# Patient Record
Sex: Female | Born: 1940 | Race: White | Hispanic: No | Marital: Married | Smoking: Never smoker
Health system: Southern US, Community
[De-identification: ages and names within clinical notes are randomized; demographics above are authoritative.]

## PROBLEM LIST (undated history)

## (undated) DIAGNOSIS — Z972 Presence of dental prosthetic device (complete) (partial): Secondary | ICD-10-CM

## (undated) DIAGNOSIS — Z87898 Personal history of other specified conditions: Secondary | ICD-10-CM

## (undated) DIAGNOSIS — M1990 Unspecified osteoarthritis, unspecified site: Secondary | ICD-10-CM

## (undated) DIAGNOSIS — Z9889 Other specified postprocedural states: Secondary | ICD-10-CM

## (undated) DIAGNOSIS — I10 Essential (primary) hypertension: Secondary | ICD-10-CM

## (undated) DIAGNOSIS — R112 Nausea with vomiting, unspecified: Secondary | ICD-10-CM

## (undated) DIAGNOSIS — Z86718 Personal history of other venous thrombosis and embolism: Secondary | ICD-10-CM

## (undated) DIAGNOSIS — C50412 Malignant neoplasm of upper-outer quadrant of left female breast: Principal | ICD-10-CM

## (undated) DIAGNOSIS — Z973 Presence of spectacles and contact lenses: Secondary | ICD-10-CM

## (undated) DIAGNOSIS — E785 Hyperlipidemia, unspecified: Secondary | ICD-10-CM

## (undated) DIAGNOSIS — K219 Gastro-esophageal reflux disease without esophagitis: Secondary | ICD-10-CM

## (undated) HISTORY — DX: Hyperlipidemia, unspecified: E78.5

## (undated) HISTORY — DX: Unspecified osteoarthritis, unspecified site: M19.90

## (undated) HISTORY — DX: Essential (primary) hypertension: I10

## (undated) HISTORY — PX: DILATION AND CURETTAGE OF UTERUS: SHX78

## (undated) HISTORY — PX: TONSILLECTOMY: SUR1361

## (undated) HISTORY — DX: Malignant neoplasm of upper-outer quadrant of left female breast: C50.412

---

## 1970-02-28 DIAGNOSIS — Z86718 Personal history of other venous thrombosis and embolism: Secondary | ICD-10-CM

## 1970-02-28 HISTORY — PX: PULMONARY EMBOLISM SURGERY: SHX752

## 1970-02-28 HISTORY — DX: Personal history of other venous thrombosis and embolism: Z86.718

## 1988-10-29 HISTORY — PX: ANTERIOR FUSION CERVICAL SPINE: SUR626

## 1999-05-03 ENCOUNTER — Encounter: Payer: Self-pay | Admitting: Neurosurgery

## 1999-05-06 ENCOUNTER — Inpatient Hospital Stay (HOSPITAL_COMMUNITY): Admission: RE | Admit: 1999-05-06 | Discharge: 1999-05-07 | Payer: Self-pay | Admitting: Neurosurgery

## 1999-05-06 ENCOUNTER — Encounter: Payer: Self-pay | Admitting: Neurosurgery

## 1999-06-23 ENCOUNTER — Encounter: Payer: Self-pay | Admitting: Neurosurgery

## 1999-06-23 ENCOUNTER — Encounter: Admission: RE | Admit: 1999-06-23 | Discharge: 1999-06-23 | Payer: Self-pay | Admitting: Neurosurgery

## 2002-02-28 ENCOUNTER — Encounter (INDEPENDENT_AMBULATORY_CARE_PROVIDER_SITE_OTHER): Payer: Self-pay | Admitting: Internal Medicine

## 2002-02-28 HISTORY — PX: REPLACEMENT TOTAL KNEE: SUR1224

## 2002-02-28 LAB — CONVERTED CEMR LAB: Pap Smear: NORMAL

## 2002-09-23 ENCOUNTER — Encounter: Admission: RE | Admit: 2002-09-23 | Discharge: 2002-09-23 | Payer: Self-pay | Admitting: Occupational Medicine

## 2002-09-23 ENCOUNTER — Encounter: Payer: Self-pay | Admitting: Occupational Medicine

## 2003-01-31 ENCOUNTER — Inpatient Hospital Stay (HOSPITAL_COMMUNITY): Admission: RE | Admit: 2003-01-31 | Discharge: 2003-02-04 | Payer: Self-pay | Admitting: Specialist

## 2004-02-05 ENCOUNTER — Ambulatory Visit: Payer: Self-pay | Admitting: Internal Medicine

## 2004-03-09 ENCOUNTER — Ambulatory Visit: Payer: Self-pay | Admitting: Internal Medicine

## 2004-03-16 ENCOUNTER — Ambulatory Visit: Payer: Self-pay | Admitting: Internal Medicine

## 2004-03-19 ENCOUNTER — Ambulatory Visit: Payer: Self-pay | Admitting: Internal Medicine

## 2004-06-08 ENCOUNTER — Ambulatory Visit: Payer: Self-pay | Admitting: Internal Medicine

## 2005-05-02 ENCOUNTER — Ambulatory Visit: Payer: Self-pay | Admitting: Internal Medicine

## 2005-05-02 LAB — CONVERTED CEMR LAB
AST: 22 units/L
Alkaline Phosphatase: 62 units/L
BUN: 11 mg/dL
Basophils Relative: 0.4 %
CO2: 29 meq/L
Chloride: 106 meq/L
Creatinine, Ser: 0.7 mg/dL
Eosinophils Absolute: 0.1 10*3/uL
Eosinophils Relative: 2.7 %
HCT: 40.1 %
HDL: 34.8 mg/dL
Hemoglobin: 13.9 g/dL
MCHC: 34.6 g/dL
MCV: 89.5 fL
Monocytes Absolute: 0.5 10*3/uL
Monocytes Relative: 10.2 %
Neutro Abs: 1.9 10*3/uL
Potassium: 4.1 meq/L
RBC: 4.48 M/uL
RDW: 11.9 %
Sodium: 142 meq/L
TSH: 1.33 microintl units/mL
Total Bilirubin: 0.9 mg/dL
Total CHOL/HDL Ratio: 5.9

## 2005-05-05 ENCOUNTER — Ambulatory Visit: Payer: Self-pay | Admitting: Internal Medicine

## 2005-05-31 ENCOUNTER — Ambulatory Visit: Payer: Self-pay | Admitting: Gastroenterology

## 2005-06-07 ENCOUNTER — Ambulatory Visit: Payer: Self-pay | Admitting: Gastroenterology

## 2005-07-21 ENCOUNTER — Emergency Department (HOSPITAL_COMMUNITY): Admission: EM | Admit: 2005-07-21 | Discharge: 2005-07-21 | Payer: Self-pay | Admitting: Emergency Medicine

## 2006-01-23 ENCOUNTER — Ambulatory Visit (HOSPITAL_BASED_OUTPATIENT_CLINIC_OR_DEPARTMENT_OTHER): Admission: RE | Admit: 2006-01-23 | Discharge: 2006-01-23 | Payer: Self-pay | Admitting: Specialist

## 2006-02-03 ENCOUNTER — Ambulatory Visit: Payer: Self-pay | Admitting: Internal Medicine

## 2006-02-06 ENCOUNTER — Encounter (INDEPENDENT_AMBULATORY_CARE_PROVIDER_SITE_OTHER): Payer: Self-pay | Admitting: Internal Medicine

## 2006-02-06 DIAGNOSIS — J309 Allergic rhinitis, unspecified: Secondary | ICD-10-CM | POA: Insufficient documentation

## 2006-02-06 DIAGNOSIS — M1990 Unspecified osteoarthritis, unspecified site: Secondary | ICD-10-CM | POA: Insufficient documentation

## 2006-02-06 DIAGNOSIS — Z86718 Personal history of other venous thrombosis and embolism: Secondary | ICD-10-CM

## 2006-02-06 DIAGNOSIS — F329 Major depressive disorder, single episode, unspecified: Secondary | ICD-10-CM | POA: Insufficient documentation

## 2006-02-06 DIAGNOSIS — K219 Gastro-esophageal reflux disease without esophagitis: Secondary | ICD-10-CM | POA: Insufficient documentation

## 2006-02-06 DIAGNOSIS — E785 Hyperlipidemia, unspecified: Secondary | ICD-10-CM

## 2006-02-14 ENCOUNTER — Ambulatory Visit (HOSPITAL_COMMUNITY): Admission: RE | Admit: 2006-02-14 | Discharge: 2006-02-14 | Payer: Self-pay | Admitting: Internal Medicine

## 2006-02-15 ENCOUNTER — Encounter (INDEPENDENT_AMBULATORY_CARE_PROVIDER_SITE_OTHER): Payer: Self-pay | Admitting: Internal Medicine

## 2006-02-28 HISTORY — PX: COLONOSCOPY: SHX174

## 2006-06-22 ENCOUNTER — Ambulatory Visit: Payer: Self-pay | Admitting: Internal Medicine

## 2006-06-22 DIAGNOSIS — M949 Disorder of cartilage, unspecified: Secondary | ICD-10-CM

## 2006-06-22 DIAGNOSIS — M899 Disorder of bone, unspecified: Secondary | ICD-10-CM | POA: Insufficient documentation

## 2006-06-22 DIAGNOSIS — I4949 Other premature depolarization: Secondary | ICD-10-CM

## 2006-06-23 LAB — CONVERTED CEMR LAB
ALT: 18 units/L (ref 0–35)
Basophils Absolute: 0.1 10*3/uL (ref 0.0–0.1)
CO2: 22 meq/L (ref 19–32)
Calcium: 9.4 mg/dL (ref 8.4–10.5)
Chloride: 105 meq/L (ref 96–112)
Cholesterol: 228 mg/dL — ABNORMAL HIGH (ref 0–200)
Hemoglobin: 14.4 g/dL (ref 12.0–15.0)
Lymphocytes Relative: 42 % (ref 12–46)
Lymphs Abs: 2 10*3/uL (ref 0.7–3.3)
Monocytes Absolute: 0.4 10*3/uL (ref 0.2–0.7)
Neutro Abs: 2.2 10*3/uL (ref 1.7–7.7)
Platelets: 176 10*3/uL (ref 150–400)
RDW: 13 % (ref 11.5–14.0)
Sodium: 140 meq/L (ref 135–145)
Total Protein: 7.1 g/dL (ref 6.0–8.3)
WBC: 4.8 10*3/uL (ref 4.0–10.5)

## 2006-11-10 ENCOUNTER — Ambulatory Visit: Payer: Self-pay | Admitting: Internal Medicine

## 2006-11-10 DIAGNOSIS — J209 Acute bronchitis, unspecified: Secondary | ICD-10-CM

## 2006-11-24 ENCOUNTER — Ambulatory Visit: Payer: Self-pay | Admitting: Internal Medicine

## 2007-01-16 ENCOUNTER — Telehealth (INDEPENDENT_AMBULATORY_CARE_PROVIDER_SITE_OTHER): Payer: Self-pay | Admitting: Internal Medicine

## 2007-01-16 ENCOUNTER — Ambulatory Visit: Payer: Self-pay | Admitting: Internal Medicine

## 2007-02-27 ENCOUNTER — Telehealth (INDEPENDENT_AMBULATORY_CARE_PROVIDER_SITE_OTHER): Payer: Self-pay | Admitting: *Deleted

## 2007-02-27 ENCOUNTER — Ambulatory Visit: Payer: Self-pay | Admitting: Internal Medicine

## 2007-02-27 DIAGNOSIS — M545 Low back pain: Secondary | ICD-10-CM | POA: Insufficient documentation

## 2008-02-08 ENCOUNTER — Ambulatory Visit: Payer: Self-pay | Admitting: Internal Medicine

## 2008-02-08 DIAGNOSIS — R209 Unspecified disturbances of skin sensation: Secondary | ICD-10-CM

## 2008-02-13 ENCOUNTER — Ambulatory Visit (HOSPITAL_COMMUNITY): Admission: RE | Admit: 2008-02-13 | Discharge: 2008-02-13 | Payer: Self-pay | Admitting: Internal Medicine

## 2008-02-18 ENCOUNTER — Telehealth (INDEPENDENT_AMBULATORY_CARE_PROVIDER_SITE_OTHER): Payer: Self-pay | Admitting: *Deleted

## 2008-02-20 ENCOUNTER — Encounter (INDEPENDENT_AMBULATORY_CARE_PROVIDER_SITE_OTHER): Payer: Self-pay | Admitting: Internal Medicine

## 2008-07-25 ENCOUNTER — Ambulatory Visit: Payer: Self-pay | Admitting: Internal Medicine

## 2008-07-25 DIAGNOSIS — I10 Essential (primary) hypertension: Secondary | ICD-10-CM

## 2008-07-25 DIAGNOSIS — H8110 Benign paroxysmal vertigo, unspecified ear: Secondary | ICD-10-CM | POA: Insufficient documentation

## 2008-07-30 LAB — CONVERTED CEMR LAB
Albumin: 4 g/dL (ref 3.5–5.2)
BUN: 13 mg/dL (ref 6–23)
CO2: 25 meq/L (ref 19–32)
Calcium: 8.6 mg/dL (ref 8.4–10.5)
Chloride: 105 meq/L (ref 96–112)
Glucose, Bld: 95 mg/dL (ref 70–99)
HDL: 36 mg/dL — ABNORMAL LOW (ref >39)
Potassium: 4.7 meq/L (ref 3.5–5.3)
Triglycerides: 103 mg/dL (ref <150)

## 2008-08-12 ENCOUNTER — Encounter (INDEPENDENT_AMBULATORY_CARE_PROVIDER_SITE_OTHER): Payer: Self-pay | Admitting: Internal Medicine

## 2009-01-14 ENCOUNTER — Ambulatory Visit (HOSPITAL_COMMUNITY): Admission: RE | Admit: 2009-01-14 | Discharge: 2009-01-14 | Payer: Self-pay | Admitting: Family Medicine

## 2009-01-19 ENCOUNTER — Ambulatory Visit (HOSPITAL_COMMUNITY): Admission: RE | Admit: 2009-01-19 | Discharge: 2009-01-19 | Payer: Self-pay | Admitting: Family Medicine

## 2010-01-26 ENCOUNTER — Ambulatory Visit (HOSPITAL_COMMUNITY): Admission: RE | Admit: 2010-01-26 | Discharge: 2010-01-26 | Payer: Self-pay | Admitting: Family Medicine

## 2010-01-28 HISTORY — PX: CHOLECYSTECTOMY: SHX55

## 2010-02-03 ENCOUNTER — Ambulatory Visit (HOSPITAL_COMMUNITY)
Admission: RE | Admit: 2010-02-03 | Discharge: 2010-02-03 | Payer: Self-pay | Source: Home / Self Care | Attending: General Surgery | Admitting: General Surgery

## 2010-03-21 ENCOUNTER — Encounter: Payer: Self-pay | Admitting: Internal Medicine

## 2010-05-11 LAB — SURGICAL PCR SCREEN: Staphylococcus aureus: NEGATIVE

## 2010-07-16 NOTE — Op Note (Signed)
[place]. [facility]  Patient:    Marie Gentry, Marie Gentry                  MRN: [id number] Proc. Date: 05/06/99 Adm. Date:  [id number] Disc. 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  PROCEDURE:  Anterior cervical diskectomy and fusion C5-6 and 6-7 with allograft, anterior cervical plate and tong traction.  SURGEON:  Clydene Fake, M.D.  ASSISTANT:  Izell [place]. Elesa Hacker, M.D.  ANESTHESIA:  General endotracheal anesthesia.  ESTIMATED BLOOD LOSS:  Minimal.  BLOOD GIVEN:  None.  DRAINS:  None.  COMPLICATIONS:  None.  REASON FOR PROCEDURE:  Patient is a 70 year old right-handed woman, who over the last nine months or so has had neck pain radiating to the right shoulder and this has been worsening over time and she started getting more right-sided pain, numbness and weakness.  Has also had some changes in handwriting.  No problem with gait. MRI showed stenosis and come cord compression with bilateral foraminal narrowing worse on the right at 5-6, 6-7 from spondylosis and probable small herniated nucleus pulposus also.  Patient brought in for surgery.  HOSPITAL COURSE:  Patient was brought in the operating room, general anesthesia was induced.  Patient was placed in Gardner-Wells tongs and then prepped and draped in a sterile fashion.  Site of incision was injected with 8 cc of 1% lidocaine with epinephrine and incision was then made in neck skin crease from the midline to he anterior border of the sternocleidomastoid on the left side of the neck. Hemostasis was obtained by Bovie cauterization and taken down to  the platysma and the platysma was incised with the Bovie and then blunt dissection  was taken down through the cervical fascia to the anterior cervical spine.  A needle was placed in disk space and x-ray obtained showing this was the 5-6 interspace.  This disk space was then incised with a 15 blade as the needle was removed and partial diskectomy performed.  The longus colli was then mobilized on each side with the Bovie and  reflected bilaterally and self-retaining retractor system was then placed. Again, the C5-6 and then the 6-7 disk spaces were incised with a 15 blade and diskectomy done with pituitary rongeurs.   The Cloward drill guide was then placed over the 5-6 interspace and a 10 mm Cloward drill was used to drill down through the disk space endplates, leaving a thin rim of posterior cortex.  This was repeated at -7. the microscope was then brought in for the microdissection.  Using curets the rest of the disk laterally was removed at both levels bilaterally and then using 1 and 2 mm Kerrison punches, a thin rim of posterior cortex along with the posterior longitudinal ligament was removed.  There were osteophytes worse on the right side at edge of the vertebral body and these were removed.  Foraminotomies were then  done bilaterally.  Foramina were much tighter on the right side than the left, ut both were opened.  Wound was irrigated with antibiotic solution.  Depth of vertebral body was  measured at both interspaces and a 12 mm bone dowels were then cut to be a couple of mm less length and then with traction on the Gardner-Wells tongs tapped into first the 6-7 and then the 5-6 interspaces.  We then felt underneath the bone plugs making sure there was room between the posterior aspect of the bone plug and the dura, which there was at each space.  Wound was irrigated with antibiotic solution and the microscope was removed from the field.  A 37 mm Synthes  small-stature plate was placed over the vertebral bodies and two screws  placed in the C5 and two into C7.  X-ray was then obtained showing good position of both bone plugs, plate and screws.  Locking plates were then placed and retractor system was removed.  Hemostasis was obtained with Gelfoam and thrombin, and bipolar cauterization and Gelfoam was then irrigated out.  Small piece of Surgicel was placed in there and wound was irrigated with antibiotic solution and irrigation came back as clear and the platysma was closed with 3-0 Vicryl interrupted sutures and the subcutaneous tissue was closed with the same and then a running 4-0 Vicryl subcuticular stitch was used to close the skin.  Steri-Strips were placed, dressing was placed.  Patient had a soft cervical collar placed and the Gardner-Wells tongs removed.  She was then awoken from anesthesia and transferred to recovery room n stable condition. DD:  05/06/99 TD:  05/08/99 Job: [id number]

## 2010-07-16 NOTE — Discharge Summary (Signed)
[place]. [facility]  Patient:    Marie Gentry, Marie Gentry                  MRN: [id number] Adm. Date:  [id number] Disc. Date: [id number] Attending:  Colon Branch                           Discharge Summary  DIAGNOSIS:  Spondylosis of herniated nucleus pulposus, 5-6 and 6-7, with myelopathy and right-sided radiculopathy.  PROCEDURE:  Anterior cervical diskectomy and fusion, C5-6 and C7 allograft anterior cervical plate and tong traction.  HISTORY OF PRESENT ILLNESS:  Patient is a 70 year old right-handed woman who, for the last 10 months or so, had had neck pain radiating to the right shoulder blade and occasionally to the left side, but getting worse over time.  She also has been getting weakness in the right arm with numbness and tingling in her right arm, nd she has been dropping things.  She has noticed actually some burning pain to her thumb.  No ______ in her arms and legs, no change in her handwriting.  She has been on the Celebrex which has not helped.  MRI was done showing severe spondylosis at 5-6 and C-7 with canal stenosis and cord compression, worse on the right side. Patient admitted for surgery.  HOSPITAL COURSE:  Patient was admitted the day of surgery and underwent the procedure named above without complication.  Postoperatively, patient was transferred to the recovery room and then to the floor.  There, she started ambulating and was doing well.  That first postoperative day, she was afebrile,  noticed much less arm pain, still a little weakness but maybe some improvement nd definitely improved numbness.  She was eating well, was up ambulating, and was discharged home in stable condition.  DISCHARGE MEDICATIONS: 1. Vicodin p.r.n. 2. Soma p.r.n. 3. Preoperative medications other than no NSAIDS, that was atenolol and Prempro.  WOUND CARE:  Keep incision dry for five days.  ACTIVITY:  No lifting, bending, twisting, keep C collar  on at all times.  FOLLOW-UP:  Follow up in three weeks in my office. DD:  05/27/99 TD:  05/27/99 Job: [id number]

## 2010-07-16 NOTE — Op Note (Signed)
NAMEVIDHI, DELELLIS          ACCOUNT NO.:  [id number]   MEDICAL RECORD NO.:  [id number]          PATIENT TYPE:  AMB   LOCATION:  [facility]                         FACILITY:  [facility]   PHYSICIAN:  Jene Every, M.D.    DATE OF BIRTH:  12-23-1940   DATE OF PROCEDURE:  01/23/2006  DATE OF DISCHARGE:                               OPERATIVE REPORT   PREOPERATIVE DIAGNOSIS:  Degenerative joint disease, left knee.   POSTOPERATIVE DIAGNOSIS:  Degenerative joint disease, left knee.  Lateral meniscus tear, central grade 3 changes of the lateral  compartment, patellofemoral joint.   PROCEDURE PERFORMED:  Left knee arthroscopy, partial lateral  meniscectomy, chondroplasty of the patella, medial and lateral femoral  condyle, shaving the lateral meniscus, shaving the medial meniscus.   BRIEF HISTORY/INDICATIONS:  A 70 year old with knee pain, giving way,  swelling, locking, MRI indicating possible meniscus tear and  degenerative changes.  Operative intervention was indicated for  diagnosis and treatment.  The risks and benefits have been discussed,  including bleeding, infection, no change in symptoms, worsening  symptoms, need for repeat debridement or total knee arthroplasty in the  future, etc.   TECHNIQUE:  With the patient supine, after the induction of adequate  general anesthesia and 500 mg of vancomycin, the left lower extremity  was prepped and draped in the usual sterile fashion.  A lateral  parapatellar portal and a superior medial parapatellar portal was  fashioned with a #11 blade.  The egress cannula atraumatically placed.  Irrigant was utilized to insufflate the joint.  Under direct  visualization, a medial parapatellar portal was fashioned with a #11  blade after localization with an 18 gauge needle, sparing the medial  meniscus.  Noted was extensive grade 3 changes of the medial compartment  with chondral flap tears and loose cartilaginous bodies.  A 3.5 coude  shaver was  introduced and utilized to perform a chondroplasty of the  medial femoral condyle and tibial plateau and shaving of the lateral  meniscus.  There was a tear noted posteriorly, and a straight basket  rongeur was introduced, utilized to perform a partial lateral  meniscectomy to a stable base.  This was stable to end-probe palpation.  Loose cartilaginous debris was evacuated.  There were some mild  degenerative changes in the ACL and the PCL.  The medial compartment  revealed degenerative fraying of the medial meniscus.  This was shaved.  Femoral condyle and tibial plateau were relatively spared.  There was  patellofemoral chondromalacia as well with normal patellofemoral  tracking.  Grade 3 changes were noted here.  Chondroplasties were  performed of the patella and the patellofemoral sulcus.  The knee was  then copiously lavaged, and all compartments were re-examined, this  including the gutters.  There was no loose cartilaginous debris or  unstable meniscus amenable to surgical intervention.  Again, after  copious lavage, all instrumentation was removed.  The portals were  closed with 4-0 nylon simple sutures, and 0.25% Marcaine with  epinephrine was infiltrated into the joint.  The wound was dressed  sterilely.  She was awakened without difficulty and transported to the  recovery  room in satisfactory condition.  Patient tolerated the  procedure well.  There were no complications.     Jene Every, M.D.  Electronically Signed    JB/MEDQ  D:  01/23/2006  T:  01/23/2006  Job:  [id number]

## 2010-07-16 NOTE — Op Note (Signed)
NAME:  Marie Gentry, Marie Gentry NO.:  [id number]   MEDICAL RECORD NO.:  [id number]                   PATIENT TYPE:  INP   LOCATION:  0480                                 FACILITY:  [facility]   PHYSICIAN:  Erasmo Leventhal, M.D.         DATE OF BIRTH:  06-Jan-1941   DATE OF PROCEDURE:  01/31/2003  DATE OF DISCHARGE:                                 OPERATIVE REPORT   PREOPERATIVE DIAGNOSIS:  Right knee valgus malalignment with end-stage  osteoarthritis.   POSTOPERATIVE DIAGNOSIS:  Right knee valgus malalignment with end-stage  osteoarthritis.   OPERATION/PROCEDURE:  Right total knee arthroplasty.   SURGEON:  Erasmo Leventhal, M.D.   ASSISTANT:  Jaquelyn Bitter. Chabon, P.A.-C.   ANESTHESIA:  Femoral nerve block with general.   ESTIMATED BLOOD LOSS:  Less than 100 mL.   DRAINS:  Two Hemovacs.   COMPLICATIONS:  None.   TOURNIQUET TIME:  Two hours at 375 mmHg.   COMPLICATIONS:  None.   DISPOSITION:  To PACU stable.   OPERATIVE IMPLANTS:  Osteonics components, all cemented, size 9 femur, size  7 tibia, 12 mm Flex insert with a 26 mm patella, posterior stabilized.   DESCRIPTION OF PROCEDURE:  The patient is counseled in the holding area.  Correct side was identified.  IV was started and block was given.  The chart  was signed appropriately.  Femoral nerve block was administered by  anesthesia.   She taken  to the operating room and placed in the lithotomy position under  general anesthesia.  Foley catheter was placed utilizing the sterile  technique by the OR circulating nurse.  Right knee was examined with 5  degrees recurvatum and flexion to 130.  Elevated and prepped with DuraPrep  and draped in the sterile fashion, exsanguinated with Esmarch.  Tourniquet  was inflated to 375 mmHg.   Straight midline incision made through the skin and subcutaneous tissue.  Medial and lateral soft tissue flaps were developed.  Skin flaps were  protected and made  sure they were all the appropriate depth.  Medial  parapatellar arthrotomy was performed.  Proximal medial tibia was exposed  but no significant medial soft tissue release was done.  Patella was everted  and the knee flexed.  End-stage osteoarthritic change.  Cruciate ligaments  were resected.  Starter hole was mid distal femur.  Distal femur was  irrigated until the effluent was clear.  I initially chose an 8 mm cut off  the distal femur at 5 degrees of valgus but that did not give sufficient off  the lateral femoral condyle.  Therefore, a 10 mm cut was taken off the  distal femur, giving a nice cut on both the medial and lateral sides.  This  was done and set a 5 degrees valgus alignment.  This femur was found to be a  size #9.  Rotational marks were made and the distal femur was cut to fit a  size #9.  During the  entire case, posterior neurovascular structures were  neurovascular structures thought of and protected.  The lateral menisci  remnants were removed.  The inferior, medial and lateral geniculate vessels  were coagulated.  Tibia eminence were resected and proximal tibia found to  be a size 7.  Starting hole was made, stripper was utilized.  Canal was  irrigated until the effluent was clear.  An intramedullary rod was then  gently placed.  I initially chose am 8 mm cut off the distal tibia based on  the least deficient side.  It is done at a neutral slope.  Posteromedial  ____________ osteophytes were removed under direct visualization.  Femoral  trochlea was prepared in the standard fashion.  At this point in time, with  a size 9 femur, size 7 tibia and a 10 mm insert.  She was a little bit tight  in flexion and extension.  Therefore, the trials were removed and we took  another 12 mm cut off the proximal tibia.  At this time a size 9 femur, size  7 tibia with a 10 mm Flex insert, we had excellent range of motion and soft  tissue balances and alignment.  Rotational marks were made  in the proximal  tibia and the Deltafit keel was performed in standard fashion.   Patella was found to be a size #26.  It was reamed to a depth of 10 mm.  Locking holes were made and  excess bone was removed.   At this point in time, utilizing the modern cement technique, all components  were cemented in place and after copiously irrigating with the pulsatile  lavage.  A size 7 tibia, size 9 femur, 26 patella.  After the cement had  cured on the 10 and 12 mm thickness, and with a 12 mm thickness we had  excellent range of motion, soft tissue balancing and flexion and extension.  Trial was removed, excessive cement was removed.  This was meticulously  cleaned and a final 12 mm thick Flex insert was applied.  Patella tracking  was a little bit lateralized at this point in time and a lateral release was  performed going through the lateral retinaculum and capsule down to the  synovium.  I left the synovium intact.  At this time patellofemoral tracking  was anatomic.   The knee joint was irrigated with antibiotic solution during the closure.  A  meticulous hemostasis was obtained also during the closure.  Two Hemovac  drains were placed in the knee joint and sequential closure in layers was  done.  Arthrotomy with Vicryl, subcu with Vicryl and skin with subcuticular  Monocryl suture.  Steri-Strips were applied loosely and Xeroform around the  drain.  Sterile compressive dressings applied.  Tourniquet was deflated.  She had normal circulation of the foot ankle.  Indication of no  complications.  She was gently awakened and taken to the recovery room in  stable condition.  There were no complications.  Sponge and needle count was  correct.                                               Erasmo Leventhal, M.D.    RAC/MEDQ  D:  01/31/2003  T:  02/01/2003  Job:  [id number]

## 2010-07-16 NOTE — Discharge Summary (Signed)
NAME:  Marie Gentry, Marie Gentry                   ACCOUNT NO.:  [id number]   MEDICAL RECORD NO.:  [id number]                   PATIENT TYPE:  INP   LOCATION:  0480                                 FACILITY[facility]   PHYSICIAN:  Erasmo Leventhal, M.D.         DATE OF BIRTH:  [birth year]   DATE OF ADMISSION:  01/31/2003  DATE OF DISCHARGE:  02/04/2003                                 DISCHARGE SUMMARY   ADMISSION DIAGNOSIS:  End-stage osteoarthritis right knee.   DISCHARGE DIAGNOSIS:  End-stage osteoarthritis right knee.   OPERATION:  Total knee arthroplasty right knee.   BRIEF HISTORY:  This is a 70 year old lady with a long history of right knee  pain who has failed conservative management to control her pain.  With her  end-stage osteoarthritis, we have discussed total knee arthroplasty, risks,  benefits and aftercare.  After discussion of treatment options, she wishes  to proceed with total knee arthroplasty and is scheduled for same.   LABORATORY VALUES:  Admission CBC within normal limits.  PT and PTT within  normal limits.  CMET also within normal limits with the exception of ALT  which was slightly elevated at 46.  Her urinalysis prior to admission showed  trace of hemoglobin, some small amount of leukocyte esterase, 3-6 wbc's.  This was repeated with a clean-catch urine and was negative.  Her hemoglobin  and hematocrit reached a low of 9.3 and 26.3.  During her admission she had  elevated glucoses and her calcium was mildly low.  Her PT/INR was 20.1 and  2.2 by discharge.   COURSE IN THE HOSPITAL:  Patient tolerated the operative procedure well.  First postoperative day the patient was doing well.  She was alert and  awake, dressing was dry, Hemovac was discontinued without problems, her  calves were negative, CPM was started, and dressing change was set for  Sunday.  The second postoperative day she continued to do well, vital signs  were stable, she was afebrile, she had  some mild itching which was likely  due to her PCA morphine, her hemoglobin and hematocrit were stable, her INR  was 2.0, BMET was within normal limits, dressings were changes, and wounds  were clean.  She was weaned off the PCA and continued with total knee  protocol.  Third postoperative day she was feeling good, she wanted to know  when she could go home, her vital signs were stable, she was afebrile,  hemoglobin and hematocrit 9.3 and 26.3, BMET within normal limits with the  exception of elevated glucose, her INR got up to 3.1 and this was corrected  by pharmacy.  Lungs were clear, bowel sounds were sluggish, and her wound  was benign.  Fourth postoperative day she was feeling good, she states she  really wanted to go home, her vital signs were stable, she was afebrile, her  dressing was changed, her wound was benign, her calves were negative, lungs  were clear, bowel sounds active,  PT/INR was 20.1 with INR of 2.2.  She is  stable and subsequently allowed to be discharged.   CONDITION ON DISCHARGE:  Improved.   DISCHARGE MEDICATIONS:  1. Percocet one to two q.6h. p.r.n. pain.  2. Robaxin 500 one p.o. q.8h. p.r.n. spasm.  3. Trinsicon one pill twice a day for anemia.  4. Coumadin as directed by pharmacy.  She will be checked on an every-other-     day basis as she was very susceptible to her Coumadin.   DISCHARGE INSTRUCTIONS:  She is to have the dressing changed by the nurse.  She is to do her home physical therapy.  Return to our office in 10 days for  recheck or see [place] sooner p.r.n. problems.     Jaquelyn Bitter. Chabon, P.A.                   Erasmo Leventhal, M.D.    SJC/MEDQ  D:  02/08/2003  T:  02/08/2003  Job:  [id number]

## 2010-07-16 NOTE — H&P (Signed)
NAME:  Marie Gentry, Marie Gentry NO.:  [id number]   MEDICAL RECORD NO.:  [id number]                   PATIENT TYPE:  INP   LOCATION:  NA                                   FACILITY:  [facility]   PHYSICIAN:  Erasmo Leventhal, M.D.         DATE OF BIRTH:  Mar 13, 1940   DATE OF ADMISSION:  DATE OF DISCHARGE:                                HISTORY & PHYSICAL   DATE OF ANTICIPATED SURGERY:  January 31, 2003.   CHIEF COMPLAINT:  End-stage osteoarthritis right knee.   BRIEF HISTORY:  This is a 70 year old lady with a long history of right knee  pain which has failed conservative management for control of her pain.  With  end-stage osteoarthritis, we have discussed total knee arthroplasty, risks,  benefits, and aftercare.  After a discussion of treatment options, the  patient is now scheduled for a total knee arthroplasty of the right knee.   PAST MEDICAL HISTORY:   DRUG ALLERGIES:  PENICILLIN which is an anaphylactic type reaction.   CURRENT MEDICATIONS:  1. Atenolol 25 mg one daily for PVCs.  2. Celexa 20 mg one daily.  3. Zocor 20 mg one daily.  4. Mobic 7.5 mg one daily.   PAST SURGICAL HISTORY:  Previous surgeries include rhinoplasty, C-spine  fusion, and right ankle reconstruction.   SERIOUS MEDICAL ILLNESSES:  Include a history of PVCs, hypercholesterolemia,  and history of DVT in the right leg with PE 30 years ago.  No complications  or problems with that since then.   SOCIAL HISTORY:  The patient is married.  She works as an Char[employer] fundraiser.  She does not  smoke and drinks occasionally.   FAMILY HISTORY:  Positive for hypertension and CVA.   REVIEW OF SYSTEMS:  CENTRAL NERVOUS SYSTEM:  Negative for a headache,  blurred vision, or dizziness.  PULMONARY:  Negative for shortness of breath,  PND, or orthopnea, but a history of a PE 30 years ago.  CARDIOVASCULAR:  Negative for chest pain and palpitation.  Positive for PVCs controlled with  atenolol.  GI:  Positive  for GERD, negative for ulcers or hepatitis.  GU:  Negative for urinary tract difficulty.  MUSCULOSKELETAL:  Positive in the  HPI.   PHYSICAL EXAMINATION:  GENERAL:  A well-developed, well-nourished lady in no  acute distress.  VITAL SIGNS:  BP 124/78, respirations 12, pulse 68 and regular.  HEENT:  Head normocephalic.  Nose patent.  Ears patent.  Pupils equal,  round, reactive to light.  Throat without injection.  NECK:  Supple without adenopathy.  Carotids 2+ without bruit.  CHEST:  Clear to auscultation.  No rales or rhonchi.  Respirations 12.  HEART:  Regular rate and rhythm at 68 beats per minute without murmur.  ABDOMEN:  Soft with active bowel sounds.  No mass or organomegaly.  NEUROLOGICAL:  The patient alert and oriented to time, place, and person.  Cranial nerves II-XII grossly intact.  EXTREMITIES:  Shows the right knee  with a valgus deformity.  There is 0 to  135 degree range of motion. Dorsalis pedis, posterior tibialis pulses are 2+  and x-ray shows end-stage osteoarthritis.   ASSESSMENT:  End-stage osteoarthritis right knee.   PLAN:  Total knee arthroplasty right knee under general anesthesia with a  femoral nerve block so that we may start Lovenox postoperatively to cover  her for DVT and PE until her Coumadin is therapeutic.     Jaquelyn Bitter. Chabon, P.A.                   Erasmo Leventhal, M.D.    SJC/MEDQ  D:  01/17/2003  T:  01/17/2003  Job:  [id number]

## 2011-07-06 ENCOUNTER — Other Ambulatory Visit: Payer: Self-pay | Admitting: Family Medicine

## 2011-07-06 DIAGNOSIS — Z139 Encounter for screening, unspecified: Secondary | ICD-10-CM

## 2011-07-12 ENCOUNTER — Ambulatory Visit (HOSPITAL_COMMUNITY)
Admission: RE | Admit: 2011-07-12 | Discharge: 2011-07-12 | Disposition: A | Discharge status: Home / Self Care | Payer: Medicare PPO | Source: Ambulatory Visit | Attending: Family Medicine | Admitting: Family Medicine

## 2011-07-12 DIAGNOSIS — Z139 Encounter for screening, unspecified: Secondary | ICD-10-CM

## 2012-02-07 ENCOUNTER — Other Ambulatory Visit: Payer: Self-pay | Admitting: Family Medicine

## 2012-02-07 ENCOUNTER — Ambulatory Visit (HOSPITAL_COMMUNITY)
Admission: RE | Admit: 2012-02-07 | Discharge: 2012-02-07 | Disposition: A | Discharge status: Home / Self Care | Payer: Medicare PPO | Source: Ambulatory Visit | Attending: Family Medicine | Admitting: Family Medicine

## 2012-02-07 DIAGNOSIS — M25539 Pain in unspecified wrist: Secondary | ICD-10-CM

## 2012-07-04 ENCOUNTER — Other Ambulatory Visit (HOSPITAL_COMMUNITY): Payer: Self-pay | Admitting: Family Medicine

## 2012-07-23 ENCOUNTER — Other Ambulatory Visit (HOSPITAL_COMMUNITY): Payer: Self-pay | Admitting: Family Medicine

## 2012-08-28 ENCOUNTER — Other Ambulatory Visit (HOSPITAL_COMMUNITY): Payer: Self-pay | Admitting: Family Medicine

## 2012-09-10 ENCOUNTER — Ambulatory Visit (INDEPENDENT_AMBULATORY_CARE_PROVIDER_SITE_OTHER): Payer: 59 | Admitting: Family Medicine

## 2012-09-10 ENCOUNTER — Encounter: Payer: Self-pay | Admitting: Family Medicine

## 2012-09-10 VITALS — BP 138/96 | HR 80 | Wt 200.2 lb

## 2012-09-10 DIAGNOSIS — I10 Essential (primary) hypertension: Secondary | ICD-10-CM

## 2012-09-10 DIAGNOSIS — R259 Unspecified abnormal involuntary movements: Secondary | ICD-10-CM

## 2012-09-10 DIAGNOSIS — R251 Tremor, unspecified: Secondary | ICD-10-CM

## 2012-09-10 MED ORDER — LOSARTAN POTASSIUM 100 MG PO TABS: ORAL_TABLET | ORAL | Status: DC

## 2012-09-10 NOTE — Progress Notes (Signed)
  Subjective:    Patient ID: Marie Gentry, female    DOB: 02/22/1941, 72 y.o.   MRN: [id number]  Hypertension This is a chronic problem. The current episode started in the past 7 days. The problem has been gradually worsening since onset. The problem is uncontrolled. (Shaking, trembling) There are no associated agents to hypertension. There are no known risk factors for coronary artery disease. The current treatment provides mild improvement. There are no compliance problems.    Patient relates she has noticed trembles over the past few days blood pressures been elevated denies any severe headaches no wheezing difficulty breathing or swelling in the legs. Does not smoke or drink. Has history hypertension. Family history noncontributory.   Review of Systems See above. Negative for chest tightness pressure pain shortness breath swelling in the legs    Objective:   Physical Exam Blood pressure checked several times left and right. . Number in the mid 130s bottom number in low benign. Lungs are clear no crackles Heart regular pulses are normal Extremities no edema skin warm dry       Assessment & Plan:  HTN- cozaar change to 100mg  daily, patient we'll follow this closely and she will let us know how things are going with her blood pressure recheck her in 6 months F/u 6 months With a slight tremor I recommend checking thyroid function if it continues may need neurologic workup find no evidence of Parkinson's currently

## 2012-09-11 ENCOUNTER — Encounter: Payer: Self-pay | Admitting: Family Medicine

## 2012-09-11 LAB — T4, FREE: Free T4: 1.22 ng/dL (ref 0.80–1.80)

## 2013-03-18 ENCOUNTER — Encounter: Payer: Self-pay | Admitting: *Deleted

## 2013-03-19 ENCOUNTER — Encounter: Payer: Self-pay | Admitting: Family Medicine

## 2013-03-19 ENCOUNTER — Ambulatory Visit (INDEPENDENT_AMBULATORY_CARE_PROVIDER_SITE_OTHER): Payer: 59 | Admitting: Family Medicine

## 2013-03-19 VITALS — BP 140/90 | Ht 65.0 in | Wt 204.0 lb

## 2013-03-19 DIAGNOSIS — M6740 Ganglion, unspecified site: Secondary | ICD-10-CM

## 2013-03-19 DIAGNOSIS — Z0189 Encounter for other specified special examinations: Secondary | ICD-10-CM

## 2013-03-19 DIAGNOSIS — Z79899 Other long term (current) drug therapy: Secondary | ICD-10-CM

## 2013-03-19 DIAGNOSIS — E785 Hyperlipidemia, unspecified: Secondary | ICD-10-CM

## 2013-03-19 DIAGNOSIS — I10 Essential (primary) hypertension: Secondary | ICD-10-CM

## 2013-03-19 LAB — BASIC METABOLIC PANEL
BUN: 15 mg/dL (ref 6–23)
CALCIUM: 9.5 mg/dL (ref 8.4–10.5)
CHLORIDE: 102 meq/L (ref 96–112)
CO2: 28 meq/L (ref 19–32)
CREATININE: 0.78 mg/dL (ref 0.50–1.10)
Glucose, Bld: 99 mg/dL (ref 70–99)
Potassium: 4 mEq/L (ref 3.5–5.3)
SODIUM: 138 meq/L (ref 135–145)

## 2013-03-19 LAB — HEPATIC FUNCTION PANEL
ALK PHOS: 63 U/L (ref 39–117)
ALT: 38 U/L — ABNORMAL HIGH (ref 0–35)
AST: 30 U/L (ref 0–37)
Albumin: 4.1 g/dL (ref 3.5–5.2)
BILIRUBIN DIRECT: 0.2 mg/dL (ref 0.0–0.3)
BILIRUBIN INDIRECT: 0.8 mg/dL (ref 0.0–0.9)
TOTAL PROTEIN: 7 g/dL (ref 6.0–8.3)
Total Bilirubin: 1 mg/dL (ref 0.3–1.2)

## 2013-03-19 LAB — LIPID PANEL
Cholesterol: 180 mg/dL (ref 0–200)
HDL: 45 mg/dL (ref >39)
LDL CALC: 117 mg/dL — AB (ref 0–99)
TRIGLYCERIDES: 88 mg/dL (ref <150)
Total CHOL/HDL Ratio: 4 Ratio
VLDL: 18 mg/dL (ref 0–40)

## 2013-03-19 NOTE — Progress Notes (Signed)
   Subjective:    Patient ID: Marie Gentry, female    DOB: [birth year], 74 y.o.   MRN: [id number]  Hypertension This is a chronic problem. The current episode started more than 1 year ago. The problem has been gradually improving since onset. The problem is controlled. Pertinent negatives include no chest pain. There are no associated agents to hypertension. There are no known risk factors for coronary artery disease. Treatments tried: losartan. The current treatment provides significant improvement. There are no compliance problems.   Patient has a knot on her right foot that has been present for 3 months now.     Review of Systems  Constitutional: Negative for activity change, appetite change and fatigue.  Respiratory: Negative for chest tightness.   Cardiovascular: Negative for chest pain.  Endocrine: Negative for polydipsia and polyphagia.  Genitourinary: Negative for frequency.  Neurological: Negative for weakness.  Psychiatric/Behavioral: Negative for confusion.       Objective:   Physical Exam  Vitals reviewed. Constitutional: She appears well-nourished. No distress.  Cardiovascular: Normal rate, regular rhythm and normal heart sounds.   No murmur heard. Pulmonary/Chest: Effort normal and breath sounds normal. No respiratory distress.  Musculoskeletal: She exhibits no edema.  Lymphadenopathy:    She has no cervical adenopathy.  Neurological: She is alert. She exhibits normal muscle tone.  Psychiatric: Her behavior is normal.          Assessment & Plan:  #1 hypertension-watch diet, stay physically active, try to lose some weight. Continue medications. Recheck 6 months. #2 reflux under good control continue current measures #3 small ganglion cyst right foot if he gets worse and bothers her we will refer her she will let us know

## 2013-03-20 LAB — VITAMIN D 25 HYDROXY (VIT D DEFICIENCY, FRACTURES): Vit D, 25-Hydroxy: 22 ng/mL — ABNORMAL LOW (ref 30–89)

## 2013-05-03 ENCOUNTER — Ambulatory Visit (INDEPENDENT_AMBULATORY_CARE_PROVIDER_SITE_OTHER): Payer: 59 | Admitting: Nurse Practitioner

## 2013-05-03 ENCOUNTER — Encounter: Payer: Self-pay | Admitting: Nurse Practitioner

## 2013-05-03 VITALS — BP 132/90 | Temp 98.5°F | Ht 65.0 in | Wt 205.2 lb

## 2013-05-03 DIAGNOSIS — J0110 Acute frontal sinusitis, unspecified: Secondary | ICD-10-CM

## 2013-05-03 MED ORDER — AZITHROMYCIN 250 MG PO TABS: ORAL_TABLET | ORAL | Status: DC

## 2013-05-03 MED ORDER — HYDROCODONE-HOMATROPINE 5-1.5 MG/5ML PO SYRP: 5.0000 mL | ORAL_SOLUTION | ORAL | Status: DC | PRN

## 2013-05-04 ENCOUNTER — Encounter: Payer: Self-pay | Admitting: Nurse Practitioner

## 2013-05-04 NOTE — Progress Notes (Signed)
Subjective:  Presents complaints of sinus congestion and cough that began on 2/20. Frequent cough worse at night. Producing green mucus at times. No wheezing. No fever. Frontal area headache. No sore throat or ear pain. Over the past couple of days eyes have been matted together in the morning with greenish drainage. No eye pain burning or itching.  Objective:   BP 132/90  Temp(Src) 98.5 F (36.9 C) (Oral)  Ht 5\' 5"  (1.651 m)  Wt 205 lb 4 oz (93.101 kg)  BMI 34.16 kg/m2 NAD. Alert, oriented. TMs clear effusion, no erythema. Pharynx injected with green PND noted. Neck supple with mild soft anterior adenopathy. Lungs clear. Heart regular rate rhythm. Conjunctiva clear. No preauricular adenopathy.  Assessment:Acute frontal sinusitis  Plan: Meds ordered this encounter  Medications  . VITAMIN D, CHOLECALCIFEROL, PO    Sig: Take by mouth.  Marland Kitchen azithromycin (ZITHROMAX Z-PAK) 250 MG tablet    Sig: Take 2 tablets (500 mg) on  Day 1,  followed by 1 tablet (250 mg) once daily on Days 2 through 5.    Dispense:  6 each    Refill:  0    Order Specific Question:  Supervising Provider    Answer:  Mikey Kirschner [[id number]]  . HYDROcodone-homatropine (HYCODAN) 5-1.5 MG/5ML syrup    Sig: Take 5 mLs by mouth every 4 (four) hours as needed.    Dispense:  120 mL    Refill:  0    Order Specific Question:  Supervising Provider    Answer:  Mikey Kirschner [[id number]]   Continue Mucinex DM as directed. Call back next week if no improvement, sooner if worse.

## 2013-06-18 ENCOUNTER — Other Ambulatory Visit: Payer: Self-pay | Admitting: Family Medicine

## 2013-08-14 ENCOUNTER — Other Ambulatory Visit: Payer: Self-pay | Admitting: Family Medicine

## 2013-11-05 ENCOUNTER — Other Ambulatory Visit: Payer: Self-pay | Admitting: Family Medicine

## 2013-12-27 ENCOUNTER — Ambulatory Visit (INDEPENDENT_AMBULATORY_CARE_PROVIDER_SITE_OTHER): Payer: 59 | Admitting: Family Medicine

## 2013-12-27 ENCOUNTER — Encounter: Payer: Self-pay | Admitting: Family Medicine

## 2013-12-27 VITALS — BP 138/82 | Temp 98.6°F | Ht 65.0 in | Wt 201.0 lb

## 2013-12-27 DIAGNOSIS — J0110 Acute frontal sinusitis, unspecified: Secondary | ICD-10-CM

## 2013-12-27 MED ORDER — ATENOLOL 25 MG PO TABS: ORAL_TABLET | ORAL | Status: DC

## 2013-12-27 MED ORDER — AZITHROMYCIN 250 MG PO TABS: ORAL_TABLET | ORAL | Status: DC

## 2013-12-27 MED ORDER — HYDROCODONE-HOMATROPINE 5-1.5 MG/5ML PO SYRP: 5.0000 mL | ORAL_SOLUTION | Freq: Four times a day (QID) | ORAL | Status: DC | PRN

## 2013-12-27 NOTE — Progress Notes (Signed)
   Subjective:    Patient ID: Marie Gentry, female    DOB: 19-Oct-1940, 73 y.o.   MRN: [id number]  Cough This is a new problem. Episode onset: Wednesday. The problem has been gradually worsening. The problem occurs every few minutes. The cough is productive of purulent sputum. Associated symptoms include headaches, nasal congestion and rhinorrhea. Pertinent negatives include no chest pain, ear pain, fever, shortness of breath or wheezing. Nothing aggravates the symptoms. She has tried OTC cough suppressant for the symptoms.      Review of Systems  Constitutional: Negative for fever and activity change.  HENT: Positive for congestion and rhinorrhea. Negative for ear pain.   Eyes: Negative for discharge.  Respiratory: Positive for cough. Negative for shortness of breath and wheezing.   Cardiovascular: Negative for chest pain.  Neurological: Positive for headaches.       Objective:   Physical Exam  Nursing note and vitals reviewed. Constitutional: She appears well-developed.  HENT:  Head: Normocephalic.  Nose: Nose normal.  Mouth/Throat: Oropharynx is clear and moist. No oropharyngeal exudate.  Neck: Neck supple.  Cardiovascular: Normal rate and normal heart sounds.   No murmur heard. Pulmonary/Chest: Effort normal and breath sounds normal. She has no wheezes.  Lymphadenopathy:    She has no cervical adenopathy.  Skin: Skin is warm and dry.    Blood pressure recheck 138/82      Assessment & Plan:  Viral syndrome with secondary sinusitis antibody which prescribed warning signs discussed follow-up for hypertension within the next several months

## 2014-01-01 ENCOUNTER — Other Ambulatory Visit: Payer: Self-pay | Admitting: Family Medicine

## 2014-01-31 ENCOUNTER — Encounter: Payer: Self-pay | Admitting: Family Medicine

## 2014-01-31 ENCOUNTER — Ambulatory Visit (INDEPENDENT_AMBULATORY_CARE_PROVIDER_SITE_OTHER): Payer: 59 | Admitting: Family Medicine

## 2014-01-31 VITALS — BP 140/98 | Ht 66.0 in | Wt 200.0 lb

## 2014-01-31 DIAGNOSIS — Z79899 Other long term (current) drug therapy: Secondary | ICD-10-CM

## 2014-01-31 DIAGNOSIS — E559 Vitamin D deficiency, unspecified: Secondary | ICD-10-CM

## 2014-01-31 DIAGNOSIS — I10 Essential (primary) hypertension: Secondary | ICD-10-CM

## 2014-01-31 DIAGNOSIS — E785 Hyperlipidemia, unspecified: Secondary | ICD-10-CM

## 2014-01-31 DIAGNOSIS — Z1231 Encounter for screening mammogram for malignant neoplasm of breast: Secondary | ICD-10-CM

## 2014-01-31 MED ORDER — ATENOLOL 25 MG PO TABS: ORAL_TABLET | ORAL | Status: AC

## 2014-01-31 MED ORDER — OMEPRAZOLE MAGNESIUM 20 MG PO TBEC: 20.0000 mg | DELAYED_RELEASE_TABLET | Freq: Every day | ORAL | Status: AC

## 2014-01-31 MED ORDER — CITALOPRAM HYDROBROMIDE 20 MG PO TABS: ORAL_TABLET | ORAL | Status: DC

## 2014-01-31 MED ORDER — LOSARTAN POTASSIUM 100 MG PO TABS: ORAL_TABLET | ORAL | Status: AC

## 2014-01-31 MED ORDER — INDAPAMIDE 1.25 MG PO TABS: 1.2500 mg | ORAL_TABLET | Freq: Every day | ORAL | Status: DC

## 2014-01-31 MED ORDER — DICLOFENAC SODIUM 75 MG PO TBEC: 75.0000 mg | DELAYED_RELEASE_TABLET | Freq: Every day | ORAL | Status: DC

## 2014-01-31 NOTE — Patient Instructions (Signed)
DASH Eating Plan °DASH stands for "Dietary Approaches to Stop Hypertension." The DASH eating plan is a healthy eating plan that has been shown to reduce high blood pressure (hypertension). Additional health benefits may include reducing the risk of type 2 diabetes mellitus, heart disease, and stroke. The DASH eating plan may also help with weight loss. °WHAT DO I NEED TO KNOW ABOUT THE DASH EATING PLAN? °For the DASH eating plan, you will follow these general guidelines: °· Choose foods with a percent daily value for sodium of less than 5% (as listed on the food label). °· Use salt-free seasonings or herbs instead of table salt or sea salt. °· Check with your health care provider or pharmacist before using salt substitutes. °· Eat lower-sodium products, often labeled as "lower sodium" or "no salt added." °· Eat fresh foods. °· Eat more vegetables, fruits, and low-fat dairy products. °· Choose whole grains. Look for the word "whole" as the first word in the ingredient list. °· Choose fish and skinless chicken or turkey more often than red meat. Limit fish, poultry, and meat to 6 oz (170 g) each day. °· Limit sweets, desserts, sugars, and sugary drinks. °· Choose heart-healthy fats. °· Limit cheese to 1 oz (28 g) per day. °· Eat more home-cooked food and less restaurant, buffet, and fast food. °· Limit fried foods. °· Cook foods using methods other than frying. °· Limit canned vegetables. If you do use them, rinse them well to decrease the sodium. °· When eating at a restaurant, ask that your food be prepared with less salt, or no salt if possible. °WHAT FOODS CAN I EAT? °Seek help from a dietitian for individual calorie needs. °Grains °Whole grain or whole wheat bread. Brown rice. Whole grain or whole wheat pasta. Quinoa, bulgur, and whole grain cereals. Low-sodium cereals. Corn or whole wheat flour tortillas. Whole grain cornbread. Whole grain crackers. Low-sodium crackers. °Vegetables °Fresh or frozen vegetables  (raw, steamed, roasted, or grilled). Low-sodium or reduced-sodium tomato and vegetable juices. Low-sodium or reduced-sodium tomato sauce and paste. Low-sodium or reduced-sodium canned vegetables.  °Fruits °All fresh, canned (in natural juice), or frozen fruits. °Meat and Other Protein Products °Ground beef (85% or leaner), grass-fed beef, or beef trimmed of fat. Skinless chicken or turkey. Ground chicken or turkey. Pork trimmed of fat. All fish and seafood. Eggs. Dried beans, peas, or lentils. Unsalted nuts and seeds. Unsalted canned beans. °Dairy °Low-fat dairy products, such as skim or 1% milk, 2% or reduced-fat cheeses, low-fat ricotta or cottage cheese, or plain low-fat yogurt. Low-sodium or reduced-sodium cheeses. °Fats and Oils °Tub margarines without trans fats. Light or reduced-fat mayonnaise and salad dressings (reduced sodium). Avocado. Safflower, olive, or canola oils. Natural peanut or almond butter. °Other °Unsalted popcorn and pretzels. °The items listed above may not be a complete list of recommended foods or beverages. Contact your dietitian for more options. °WHAT FOODS ARE NOT RECOMMENDED? °Grains °White bread. White pasta. White rice. Refined cornbread. Bagels and croissants. Crackers that contain trans fat. °Vegetables °Creamed or fried vegetables. Vegetables in a cheese sauce. Regular canned vegetables. Regular canned tomato sauce and paste. Regular tomato and vegetable juices. °Fruits °Dried fruits. Canned fruit in light or heavy syrup. Fruit juice. °Meat and Other Protein Products °Fatty cuts of meat. Ribs, chicken wings, bacon, sausage, bologna, salami, chitterlings, fatback, hot dogs, bratwurst, and packaged luncheon meats. Salted nuts and seeds. Canned beans with salt. °Dairy °Whole or 2% milk, cream, half-and-half, and cream cheese. Whole-fat or sweetened yogurt. Full-fat   cheeses or blue cheese. Nondairy creamers and whipped toppings. Processed cheese, cheese spreads, or cheese  curds. °Condiments °Onion and garlic salt, seasoned salt, table salt, and sea salt. Canned and packaged gravies. Worcestershire sauce. Tartar sauce. Barbecue sauce. Teriyaki sauce. Soy sauce, including reduced sodium. Steak sauce. Fish sauce. Oyster sauce. Cocktail sauce. Horseradish. Ketchup and mustard. Meat flavorings and tenderizers. Bouillon cubes. Hot sauce. Tabasco sauce. Marinades. Taco seasonings. Relishes. °Fats and Oils °Butter, stick margarine, lard, shortening, ghee, and bacon fat. Coconut, palm kernel, or palm oils. Regular salad dressings. °Other °Pickles and olives. Salted popcorn and pretzels. °The items listed above may not be a complete list of foods and beverages to avoid. Contact your dietitian for more information. °WHERE CAN I FIND MORE INFORMATION? °National Heart, Lung, and [employer] Institute: www.nhlbi.nih.gov/health/health-topics/topics/dash/ °Document Released: 02/03/2011 Document Revised: 07/01/2013 Document Reviewed: 12/19/2012 °ExitCare® Patient Information ©2015 [employer]. This information is not intended to replace advice given to you by your health care provider. Make sure you discuss any questions you have with your health care provider. ° °

## 2014-01-31 NOTE — Progress Notes (Signed)
   Subjective:    Patient ID: Marie Gentry, female    DOB: 09-17-1940, 73 y.o.   MRN: [id number]  Hypertension This is a chronic problem. The current episode started more than 1 year ago. Pertinent negatives include no chest pain.  Walks one mile every day with dogs. Eats a lot of vegetables not a lot of meat. Needs 90 day refills on all meds.  Has had flu vaccine.  No concerns.  Review of Systems  Constitutional: Negative for activity change, appetite change and fatigue.  HENT: Negative for congestion.   Cardiovascular: Negative for chest pain.  Gastrointestinal: Negative for abdominal pain.  Endocrine: Negative for polydipsia and polyphagia.  Genitourinary: Negative for frequency.  Neurological: Negative for weakness.  Psychiatric/Behavioral: Negative for confusion.       Objective:   Physical Exam  Constitutional: She appears well-nourished. No distress.  Cardiovascular: Normal rate, regular rhythm and normal heart sounds.   No murmur heard. Pulmonary/Chest: Effort normal and breath sounds normal. No respiratory distress.  Musculoskeletal: She exhibits no edema.  Lymphadenopathy:    She has no cervical adenopathy.  Neurological: She is alert. She exhibits normal muscle tone.  Psychiatric: Her behavior is normal.  Vitals reviewed.         Assessment & Plan:  HTN add amlodipine Recheck in 3 to 4 months Labs in Jan

## 2014-02-06 ENCOUNTER — Ambulatory Visit (HOSPITAL_COMMUNITY)
Admission: RE | Admit: 2014-02-06 | Discharge: 2014-02-06 | Disposition: A | Discharge status: Home / Self Care | Payer: Medicare PPO | Source: Ambulatory Visit | Attending: Family Medicine | Admitting: Family Medicine

## 2014-02-06 DIAGNOSIS — R921 Mammographic calcification found on diagnostic imaging of breast: Secondary | ICD-10-CM | POA: Insufficient documentation

## 2014-02-06 DIAGNOSIS — Z1231 Encounter for screening mammogram for malignant neoplasm of breast: Secondary | ICD-10-CM | POA: Insufficient documentation

## 2014-02-09 ENCOUNTER — Telehealth: Payer: Self-pay | Admitting: Family Medicine

## 2014-02-09 NOTE — Telephone Encounter (Signed)
Nurses, please connect with Marie Gentry, she is a very nice patient. In the past she had a red rash associated with hydrochlorothiazide. She did not have how hives. On her last visit we discussed a low-dose diuretic to help with her blood pressure. I truly doubt that this will cause an allergic reaction. Certainly if it caused a red rash or itching or hives to immediately stop it. Please let the patient know that her insurance company once [place] to double check with her before getting this filled. She should know that people who are highly allergic to hydrochlorothiazide can sometimes have allergic reaction to other diuretics. In her situation I think that risk is low. If she does not one to take on that risk then we can prescribe a different blood pressure medicine. If she is fine with it let me know and I will sign the sheet and fax it back to the insurance company thank you.

## 2014-02-10 NOTE — Telephone Encounter (Signed)
Lets sign and fax

## 2014-02-10 NOTE — Telephone Encounter (Signed)
Patient wants to proceed with diuretic

## 2014-02-11 ENCOUNTER — Other Ambulatory Visit: Payer: Self-pay | Admitting: Family Medicine

## 2014-02-11 DIAGNOSIS — R928 Other abnormal and inconclusive findings on diagnostic imaging of breast: Secondary | ICD-10-CM

## 2014-02-11 NOTE — Telephone Encounter (Signed)
Done, pt aware.

## 2014-02-17 ENCOUNTER — Other Ambulatory Visit: Payer: Self-pay | Admitting: *Deleted

## 2014-02-17 ENCOUNTER — Telehealth: Payer: Self-pay | Admitting: Family Medicine

## 2014-02-17 MED ORDER — INDAPAMIDE 1.25 MG PO TABS: 1.2500 mg | ORAL_TABLET | Freq: Every day | ORAL | Status: AC

## 2014-02-17 NOTE — Telephone Encounter (Signed)
indapamide (LOZOL) 1.25 MG tablet   See note from 12/15   Pt would like this sent to [facility] instead, her [facility] is not  Filling it for her   Please resend to [place], thanks

## 2014-02-17 NOTE — Telephone Encounter (Signed)
Med sent to pharm. Pt notified.  

## 2014-02-25 ENCOUNTER — Ambulatory Visit (HOSPITAL_COMMUNITY)
Admission: RE | Admit: 2014-02-25 | Discharge: 2014-02-25 | Disposition: A | Discharge status: Home / Self Care | Payer: Medicare PPO | Source: Ambulatory Visit | Attending: Family Medicine | Admitting: Family Medicine

## 2014-02-25 ENCOUNTER — Other Ambulatory Visit: Payer: Self-pay | Admitting: Family Medicine

## 2014-02-25 DIAGNOSIS — R928 Other abnormal and inconclusive findings on diagnostic imaging of breast: Secondary | ICD-10-CM | POA: Insufficient documentation

## 2014-02-25 DIAGNOSIS — R921 Mammographic calcification found on diagnostic imaging of breast: Secondary | ICD-10-CM

## 2014-03-07 ENCOUNTER — Ambulatory Visit
Admission: RE | Admit: 2014-03-07 | Discharge: 2014-03-07 | Disposition: A | Discharge status: Home / Self Care | Payer: Medicare PPO | Source: Ambulatory Visit | Attending: Family Medicine | Admitting: Family Medicine

## 2014-03-07 ENCOUNTER — Other Ambulatory Visit: Payer: Self-pay | Admitting: Family Medicine

## 2014-03-07 DIAGNOSIS — R921 Mammographic calcification found on diagnostic imaging of breast: Secondary | ICD-10-CM

## 2014-03-10 ENCOUNTER — Telehealth: Payer: Self-pay | Admitting: Family Medicine

## 2014-03-10 ENCOUNTER — Other Ambulatory Visit: Payer: Self-pay | Admitting: Family Medicine

## 2014-03-10 DIAGNOSIS — C50912 Malignant neoplasm of unspecified site of left female breast: Secondary | ICD-10-CM

## 2014-03-10 NOTE — Telephone Encounter (Signed)
Discussed case with patient.She will get MRI then see surgeon

## 2014-03-10 NOTE — Telephone Encounter (Signed)
The breast center called pt and informed her that she has cancer. Pt is Requesting that Dr. Nicki Reaper call her about a surgeon.

## 2014-03-11 ENCOUNTER — Encounter: Payer: Self-pay | Admitting: *Deleted

## 2014-03-11 ENCOUNTER — Other Ambulatory Visit: Payer: Self-pay | Admitting: Family Medicine

## 2014-03-11 ENCOUNTER — Telehealth: Payer: Self-pay | Admitting: *Deleted

## 2014-03-11 DIAGNOSIS — C50412 Malignant neoplasm of upper-outer quadrant of left female breast: Secondary | ICD-10-CM

## 2014-03-11 HISTORY — DX: Malignant neoplasm of upper-outer quadrant of left female breast: C50.412

## 2014-03-11 NOTE — Telephone Encounter (Signed)
Confirmed [facility] for 03/19/14 at 1200.  Instructions and contact information given.

## 2014-03-18 ENCOUNTER — Ambulatory Visit
Admission: RE | Admit: 2014-03-18 | Discharge: 2014-03-18 | Disposition: A | Discharge status: Home / Self Care | Payer: Medicare PPO | Source: Ambulatory Visit | Attending: Family Medicine | Admitting: Family Medicine

## 2014-03-18 DIAGNOSIS — C50912 Malignant neoplasm of unspecified site of left female breast: Secondary | ICD-10-CM

## 2014-03-18 MED ORDER — GADOBENATE DIMEGLUMINE 529 MG/ML IV SOLN
8.0000 mL | Freq: Once | INTRAVENOUS | Status: AC | PRN
  Administered 2014-03-18: 8 mL via INTRAVENOUS

## 2014-03-19 ENCOUNTER — Ambulatory Visit (HOSPITAL_BASED_OUTPATIENT_CLINIC_OR_DEPARTMENT_OTHER): Payer: Medicare PPO | Admitting: Hematology

## 2014-03-19 ENCOUNTER — Ambulatory Visit: Payer: Medicare PPO | Admitting: Physical Therapy

## 2014-03-19 ENCOUNTER — Encounter: Payer: Self-pay | Admitting: Hematology

## 2014-03-19 ENCOUNTER — Ambulatory Visit
Admission: RE | Admit: 2014-03-19 | Discharge: 2014-03-19 | Disposition: A | Discharge status: Home / Self Care | Payer: Medicare PPO | Source: Ambulatory Visit | Attending: Radiation Oncology | Admitting: Radiation Oncology

## 2014-03-19 ENCOUNTER — Ambulatory Visit (INDEPENDENT_AMBULATORY_CARE_PROVIDER_SITE_OTHER): Payer: Self-pay | Admitting: Surgery

## 2014-03-19 ENCOUNTER — Other Ambulatory Visit (HOSPITAL_BASED_OUTPATIENT_CLINIC_OR_DEPARTMENT_OTHER): Payer: Medicare PPO

## 2014-03-19 ENCOUNTER — Ambulatory Visit: Payer: Medicare PPO

## 2014-03-19 VITALS — BP 147/63 | HR 62 | Temp 98.2°F | Resp 18 | Ht 66.0 in | Wt 197.3 lb

## 2014-03-19 DIAGNOSIS — D0512 Intraductal carcinoma in situ of left breast: Secondary | ICD-10-CM

## 2014-03-19 DIAGNOSIS — C50412 Malignant neoplasm of upper-outer quadrant of left female breast: Secondary | ICD-10-CM

## 2014-03-19 DIAGNOSIS — Z170 Estrogen receptor positive status [ER+]: Secondary | ICD-10-CM

## 2014-03-19 LAB — CBC WITH DIFFERENTIAL/PLATELET
BASO%: 0.3 % (ref 0.0–2.0)
BASOS ABS: 0 10*3/uL (ref 0.0–0.1)
EOS%: 2.6 % (ref 0.0–7.0)
Eosinophils Absolute: 0.1 10*3/uL (ref 0.0–0.5)
HCT: 40.9 % (ref 34.8–46.6)
HGB: 13.4 g/dL (ref 11.6–15.9)
LYMPH%: 41.7 % (ref 14.0–49.7)
MCH: 31 pg (ref 25.1–34.0)
MCHC: 32.8 g/dL (ref 31.5–36.0)
MCV: 94.7 fL (ref 79.5–101.0)
MONO#: 0.5 10*3/uL (ref 0.1–0.9)
MONO%: 10.7 % (ref 0.0–14.0)
NEUT%: 44.7 % (ref 38.4–76.8)
NEUTROS ABS: 2.2 10*3/uL (ref 1.5–6.5)
PLATELETS: 180 10*3/uL (ref 145–400)
RBC: 4.32 10*6/uL (ref 3.70–5.45)
RDW: 12.4 % (ref 11.2–14.5)
WBC: 4.9 10*3/uL (ref 3.9–10.3)
lymph#: 2.1 10*3/uL (ref 0.9–3.3)

## 2014-03-19 LAB — COMPREHENSIVE METABOLIC PANEL (CC13)
ALK PHOS: 59 U/L (ref 40–150)
ALT: 20 U/L (ref 0–55)
AST: 19 U/L (ref 5–34)
Albumin: 3.7 g/dL (ref 3.5–5.0)
Anion Gap: 8 mEq/L (ref 3–11)
BUN: 27.6 mg/dL — AB (ref 7.0–26.0)
CALCIUM: 9.2 mg/dL (ref 8.4–10.4)
CO2: 28 mEq/L (ref 22–29)
CREATININE: 1.2 mg/dL — AB (ref 0.6–1.1)
Chloride: 102 mEq/L (ref 98–109)
EGFR: 44 mL/min/{1.73_m2} — AB (ref >90)
GLUCOSE: 103 mg/dL (ref 70–140)
Potassium: 4.1 mEq/L (ref 3.5–5.1)
Sodium: 138 mEq/L (ref 136–145)
Total Bilirubin: 0.39 mg/dL (ref 0.20–1.20)
Total Protein: 6.8 g/dL (ref 6.4–8.3)

## 2014-03-19 NOTE — H&P (Signed)
Marie Gentry 03/19/2014 8:48 AM Location: [facility] Patient #: [id number] DOB: [birth year] Undefined / Language: Suszanne Gentry / Race: Undefined Female History of Present Illness Marie Gentry A. [name] MD; 03/19/2014 3:28 PM) Patient words: pt CLINICAL DATA: Confirmation of clip placement after stereotactic core needle biopsy of indeterminate calcifications in the upper-outer quadrant of the left breast, anterior depth.  EXAM: DIAGNOSTIC LEFT MAMMOGRAM POST STEREOTACTIC BIOPSY  COMPARISON: Previous exams.  FINDINGS: Mammographic images were obtained following stereotactic guided biopsy of calcifications in the upper outer left breast, anterior depth. The X shaped tissue marker clip is appropriately positioned at the site of the tight group of calcifications which were biopsied. Most if not all of these calcifications were removed at the time of biopsy. Expected post biopsy changes are present without evidence of hematoma.  IMPRESSION: Appropriate positioning of the X shaped tissue marker clip in the upper-outer left breast, anterior depth, at the site of the calcifications.  Final Assessment: Post Procedure Mammograms for Marker Placement Breast, left, needle core biopsy, upper outer quadrant, anterior depth - DUCTAL CARCINOMA IN SITU WITH CALCIFICATIONS, SEE COMMENThF DR MOODY FOR LEFT BREAST lllll.  The patient is a 74 year old female who presents with breast cancer. 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 The patient was referred by a specialty consultant. Initial presentation was for an abnormal mammogram. Other Problems [name], RN; 03/19/2014 8:48 AM) Arthritis Breast Cancer Cholelithiasis Gastroesophageal Reflux Disease General anesthesia - complications Heart murmur High blood pressure Hypercholesterolemia Pulmonary Embolism / Blood Clot in Legs  Past Surgical History  [name], RN; 03/19/2014 8:48 AM) Breast Biopsy Left. Foot Surgery Right. Gallbladder Surgery - Laparoscopic Knee Surgery Right. Oral Surgery Spinal Surgery - Neck Tonsillectomy  Diagnostic Studies History [name], RN; 03/19/2014 8:48 AM) Colonoscopy 5-10 years ago Mammogram within last year Pap Smear >5 years ago  Social History [name], RN; 03/19/2014 8:48 AM) Alcohol use Occasional alcohol use. Caffeine use Coffee. No drug use Tobacco use Never smoker.  Family History [name], RN; 03/19/2014 8:48 AM) Alcohol Abuse Father. Arthritis Father. Cerebrovascular Accident Father. Colon Polyps Mother. Heart Disease Father. Hypertension Father, Mother. Respiratory Condition Father.  Pregnancy / Birth History [name], RN; 03/19/2014 8:48 AM) Age at menarche 52 years. Age of menopause 26-55 Gravida 4 Irregular periods Maternal age 76-25 Para 3     Review of Systems [name] RN; 03/19/2014 8:48 AM) General Present- Night Sweats. Not Present- Appetite Loss, Chills, Fatigue, Fever, Weight Gain and Weight Loss. Skin Not Present- Change in Wart/Mole, Dryness, Hives, Jaundice, New Lesions, Non-Healing Wounds, Rash and Ulcer. HEENT Present- Seasonal Allergies and Wears glasses/contact lenses. Not Present- Earache, Hearing Loss, Hoarseness, Nose Bleed, Oral Ulcers, Ringing in the Ears, Sinus Pain, Sore Throat, Visual Disturbances and Yellow Eyes. Respiratory Present- Snoring. Not Present- Bloody sputum, Chronic Cough, Difficulty Breathing and Wheezing. Breast Not Present- Breast Mass, Breast Pain, Nipple Discharge and Skin Changes. Cardiovascular Present- Palpitations. Not Present- Chest Pain, Difficulty Breathing Lying Down, Leg Cramps, Rapid Heart Rate, Shortness of Breath and Swelling of Extremities. Gastrointestinal Present- Gets full quickly at meals and Indigestion. Not Present- Abdominal Pain, Bloating, Bloody Stool,  Change in Bowel Habits, Chronic diarrhea, Constipation, Difficulty Swallowing, Excessive gas, Hemorrhoids, Nausea, Rectal Pain and Vomiting. Female Genitourinary Not Present- Frequency, Nocturia, Painful Urination, Pelvic Pain and Urgency. Neurological Present- Decreased Memory and Headaches. Not Present- Fainting, Numbness, Seizures, Tingling, Tremor, Trouble walking and Weakness. Psychiatric  Not Present- Anxiety, Bipolar, Change in Sleep Pattern, Depression, Fearful and Frequent crying. Endocrine Present- Cold Intolerance. Not Present- Excessive Hunger, Hair Changes, Heat Intolerance, Hot flashes and New Diabetes. Hematology Not Present- Easy Bruising, Excessive bleeding, Gland problems, HIV and Persistent Infections.   Physical Exam (Ryver Zadrozny A. Ziana Heyliger MD; 03/19/2014 3:28 PM)  General Mental Status-Alert. General Appearance-Consistent with stated age. Hydration-Well hydrated. Voice-Normal.  Head and Neck Head-normocephalic, atraumatic with no lesions or palpable masses. Trachea-midline. Thyroid Gland Characteristics - normal size and consistency.  Eye Eyeball - Bilateral-Extraocular movements intact. Sclera/Conjunctiva - Bilateral-No scleral icterus.  Chest and Lung Exam Chest and lung exam reveals -quiet, even and easy respiratory effort with no use of accessory muscles and on auscultation, normal breath sounds, no adventitious sounds and normal vocal resonance. Inspection Chest Wall - Normal. Back - normal.  Breast Breast - Left-Symmetric, Non Tender, No Biopsy scars, no Dimpling, No Inflammation, No Lumpectomy scars, No Mastectomy scars, No Peau d' Orange. Breast - Right-Symmetric, Non Tender, No Biopsy scars, no Dimpling, No Inflammation, No Lumpectomy scars, No Mastectomy scars, No Peau d' Orange. Breast Lump-No Palpable Breast Mass.  Cardiovascular Cardiovascular examination reveals -normal heart sounds, regular rate and rhythm with no murmurs and  normal pedal pulses bilaterally.  Abdomen Inspection Inspection of the abdomen reveals - No Hernias. Skin - Scar - no surgical scars. Palpation/Percussion Palpation and Percussion of the abdomen reveal - Soft, Non Tender, No Rebound tenderness, No Rigidity (guarding) and No hepatosplenomegaly. Auscultation Auscultation of the abdomen reveals - Bowel sounds normal.  Neurologic Neurologic evaluation reveals -alert and oriented x 3 with no impairment of recent or remote memory. Mental Status-Normal.  Musculoskeletal Normal Exam - Left-Upper Extremity Strength Normal and Lower Extremity Strength Normal. Normal Exam - Right-Upper Extremity Strength Normal and Lower Extremity Strength Normal.  Lymphatic Head & Neck  General Head & Neck Lymphatics: Bilateral - Description - Normal. Axillary  General Axillary Region: Bilateral - Description - Normal. Tenderness - Non Tender. Femoral & Inguinal  Generalized Femoral & Inguinal Lymphatics: Bilateral - Description - Normal. Tenderness - Non Tender.    Assessment & Plan (Zita Ozimek A. 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 discussed lumpectomy and mastectomy. she has chosen lumpectomy. Risk of lumpectomy include bleeding, infection, seroma, more surgery, use of seed/wire, wound care, cosmetic deformity and the need for other treatments, death , blood clots, death. Pt agrees to proceed.

## 2014-03-19 NOTE — Progress Notes (Signed)
Checked in new pt with no financial concerns prior to seeing the dr. I informed pt if chemo is part of her treatment I will call her ins to see if Josem Kaufmann is required and will obtain it if it is as well as contact foundations that offer copay assistance if needed. She has my card for any billing or insurance questions or concerns.

## 2014-03-19 NOTE — Progress Notes (Signed)
[place]  [phone] Fax:([phone]) [place] Note   Patient Care Team: Kathyrn Drown, MD as PCP - General (Family Medicine) Erroll Luna, MD as Consulting Physician (General Surgery) Truitt Merle, MD as Consulting Physician (Hematology) Jodelle Gross, MD as Consulting Physician (Radiation Oncology) Roselee Culver, RN as Registered Nurse [facility], RN as Registered Nurse 03/19/2014  CHIEF COMPLAINTS/PURPOSE OF CONSULTATION:  Newly diagnosed right breast DCIS    Breast cancer of upper-outer quadrant of left female breast   03/07/2014 Pathology Results Breast, left, needle core biopsy, upper outer quadrant, anterior depth - DUCTAL CARCINOMA IN SITU WITH CALCIFICATIONS, SEE COMMENT   03/11/2014 Initial Diagnosis Breast cancer of upper-outer quadrant of left female breast   03/18/2014 Breast MRI Biopsy clip artifact and biopsy changes within the anterior upper left breast in the area of biopsy-proven DCIS. Please note that there are remaining calcifications identified mammographically that lie 2 cm medial and 1.5 cm inferior to the biops   03/18/2014 Receptors her2 ER/PR+  ' HISTORY OF PRESENTING ILLNESS:  Marie Gentry 74 y.o. female presents to our breast multidisciplinary clinic today to discuss the management of her newly diagnosed breast cancer.  This was discovered on screening mammogram recently, which showed multiple sites of calcification in her left breast, across an area of 2-3 cm. She underwent ultrasound-guided biopsy on 03/11/2014, which showed DCIS, ER/PR positive. Her postbiopsy MRI on generally 19 2016 showed biopsy changes, without other findings.  She feels well overall, denies any symptoms. No recent weight loss or change of her appetite.  MEDICAL HISTORY:  Past Medical History  Diagnosis Date  . Hypertension   . Hyperlipidemia   . Breast cancer of upper-outer quadrant of left female breast 03/11/2014    . Arthritis     SURGICAL HISTORY: Past Surgical History  Procedure Laterality Date  . Pulmonary embolism surgery    . Replacement total knee Right 2006  . Anterior fusion cervical spine  1990's  . Colonoscopy  2008    negative  . Cholecystectomy  01/2010    Dr. Arnoldo Morale    SOCIAL HISTORY: History   Social History  . Marital Status: Married    Spouse Name: N/A    Number of Children: 3, one died as a infant  . Years of Education: N/A   Occupational History  . Not on file.   Social History Main Topics  . Smoking status: Never Smoker   . Smokeless tobacco: Not on file  . Alcohol Use: Yes  . Drug Use: No  . Sexual Activity: Not on file   Other Topics Concern  . Not on file   Social History Narrative   GYN HISTORY  Menarchal: 14 LMP: early 89 Contraceptive: no  HRT: no  G4P4:   FAMILY HISTORY: History reviewed. No pertinent family history.   No family history of malignancy.   ALLERGIES:  is allergic to penicillins and hctz.  MEDICATIONS:  Current Outpatient Prescriptions  Medication Sig Dispense Refill  . atenolol (TENORMIN) 25 MG tablet TAKE 1 TABLET EVERY DAY 90 tablet 1  . citalopram (CELEXA) 20 MG tablet TAKE 1 TABLET EVERY DAY 90 tablet 1  . diclofenac (VOLTAREN) 75 MG EC tablet Take 1 tablet (75 mg total) by mouth daily. 90 tablet 1  . indapamide (LOZOL) 1.25 MG tablet Take 1 tablet (1.25 mg total) by mouth daily. 90 tablet 1  . losartan (COZAAR) 100 MG tablet TAKE 1 TABLET EVERY MORNING 90  tablet 1  . omeprazole (PRILOSEC OTC) 20 MG tablet Take 1 tablet (20 mg total) by mouth daily. 90 tablet 1  . VITAMIN D, CHOLECALCIFEROL, PO Take by mouth. 2,000 iu     No current facility-administered medications for this visit.    REVIEW OF SYSTEMS:   Constitutional: Denies fevers, chills or abnormal night sweats Eyes: Denies blurriness of vision, double vision or watery eyes Ears, nose, mouth, throat, and face: Denies mucositis or sore throat Respiratory:  Denies cough, dyspnea or wheezes Cardiovascular: Denies palpitation, chest discomfort or lower extremity swelling Gastrointestinal:  Denies nausea, heartburn or change in bowel habits Skin: Denies abnormal skin rashes Lymphatics: Denies new lymphadenopathy or easy bruising Neurological:Denies numbness, tingling or new weaknesses Behavioral/Psych: Mood is stable, no new changes  All other systems were reviewed with the patient and are negative.  PHYSICAL EXAMINATION: ECOG PERFORMANCE STATUS: 0 - Asymptomatic  Filed Vitals:   03/19/14 1248  BP: 147/63  Pulse: 62  Temp: 98.2 F (36.8 C)  Resp: 18   Filed Weights   03/19/14 1248  Weight: 197 lb 4.8 oz (89.495 kg)    GENERAL:alert, no distress and comfortable SKIN: skin color, texture, turgor are normal, no rashes or significant lesions EYES: normal, conjunctiva are pink and non-injected, sclera clear OROPHARYNX:no exudate, no erythema and lips, buccal mucosa, and tongue normal  NECK: supple, thyroid normal size, non-tender, without nodularity LYMPH:  no palpable lymphadenopathy in the cervical, axillary or inguinal LUNGS: clear to auscultation and percussion with normal breathing effort HEART: regular rate & rhythm and no murmurs and no lower extremity edema ABDOMEN:abdomen soft, non-tender and normal bowel sounds Musculoskeletal:no cyanosis of digits and no clubbing  PSYCH: alert & oriented x 3 with fluent speech NEURO: no focal motor/sensory deficits Breasts: left Breast inspection showed resolving skin bruise in left breast, some fullness at the biopsy site, no discret mass. No skin change or nipple discharge. Palpation of the right  breasts and axilla revealed no obvious mass that I could appreciate.   LABORATORY DATA:  I have reviewed the data as listed Lab Results  Component Value Date   WBC 4.9 03/19/2014   HGB 13.4 03/19/2014   HCT 40.9 03/19/2014   MCV 94.7 03/19/2014   PLT 180 03/19/2014    Recent Labs   03/19/14 1234  NA 138  K 4.1  CO2 28  GLUCOSE 103  BUN 27.6*  CREATININE 1.2*  CALCIUM 9.2  PROT 6.8  ALBUMIN 3.7  AST 19  ALT 20  ALKPHOS 59  BILITOT 0.39   PATHOLOGY REPORT 03/07/2014 Breast, left, needle core biopsy, upper outer quadrant, anterior depth - DUCTAL CARCINOMA IN SITU WITH CALCIFICATIONS, SEE COMMENT. Microscopic Comment Although grade of tumor is best assessed at resection, with these biopsies, the in situ carcinoma is grade I. Breast prognostic studies are pending  ER 100%(+) PR 100%(+)  RADIOGRAPHIC STUDIES: I have personally reviewed the radiological images as listed and agreed with the findings in the report. Mr Breast Bilateral W Wo Contrast  03/18/2014   CLINICAL DATA:  74 year old female with newly diagnosed left breast DCIS.  LABS:  BUN and creatinine were obtained on site at [place] at  [address]  Results:  BUN 25 mg/dL,  Creatinine 1.2 mg/dL.  EXAM: BILATERAL BREAST MRI WITH AND WITHOUT CONTRAST  TECHNIQUE: Multiplanar, multisequence MR images of both breasts were obtained prior to and following the intravenous administration of 67m of MultiHance.  THREE-DIMENSIONAL MR IMAGE RENDERING ON INDEPENDENT WORKSTATION:  Three-dimensional MR images were rendered by post-processing of the original MR data on an independent workstation. The three-dimensional MR images were interpreted, and findings are reported in the following complete MRI report for this study. Three dimensional images were evaluated at the independent DynaCad workstation  COMPARISON:  Previous exams  FINDINGS: Breast composition: b.  Scattered fibroglandular tissue.  Background parenchymal enhancement: Mild  Right breast: No mass or abnormal enhancement.  Left breast: Biopsy clip artifact and post biopsy changes within the anterior upper left breast are identified in the area of biopsy-proven DCIS. No other abnormal areas of enhancement are identified.  Lymph nodes: No abnormal  appearing lymph nodes.  Ancillary findings:  A large hiatal hernia is noted.  IMPRESSION: Biopsy clip artifact and biopsy changes within the anterior upper left breast in the area of biopsy-proven DCIS. Please note that there are remaining calcifications identified mammographically that lie 2 cm medial and 1.5 cm inferior to the biopsy clip. Consider wire localization and bracketing of the clip and remaining calcifications if breast conservation surgery is performed.  No evidence of multicentric or contralateral malignancy. No enlarged or abnormal appearing lymph nodes.  Large hiatal hernia.  RECOMMENDATION: Treatment plan  BI-RADS CATEGORY  6: Known biopsy-proven malignancy.   Electronically Signed   By: Hassan Rowan M.D.   On: 03/18/2014 12:27   Mm Digital Diagnostic Unilat L  03/07/2014   CLINICAL DATA:  Confirmation of clip placement after stereotactic core needle biopsy of indeterminate calcifications in the upper-outer quadrant of the left breast, anterior depth.  EXAM: DIAGNOSTIC LEFT MAMMOGRAM POST STEREOTACTIC BIOPSY  COMPARISON:  Previous exams.  FINDINGS: Mammographic images were obtained following stereotactic guided biopsy of calcifications in the upper outer left breast, anterior depth. The X shaped tissue marker clip is appropriately positioned at the site of the tight group of calcifications which were biopsied. Most if not all of these calcifications were removed at the time of biopsy. Expected post biopsy changes are present without evidence of hematoma.  IMPRESSION: Appropriate positioning of the X shaped tissue marker clip in the upper-outer left breast, anterior depth, at the site of the calcifications.  Final Assessment: Post Procedure Mammograms for Marker Placement   Electronically Signed   By: Evangeline Dakin M.D.   On: 03/07/2014 15:27   Mm Digital Diagnostic Unilat L  02/25/2014   CLINICAL DATA:  The patient returns after screening study for evaluation of left breast calcifications.   EXAM: DIGITAL DIAGNOSTIC  LEFT MAMMOGRAM  COMPARISON:  02/06/2014  ACR Breast Density Category b: There are scattered areas of fibroglandular density.  FINDINGS: Magnified views are performed of calcifications in the 12:30 o'clock retroareolar region of the left breast. In this region there is a group of calcifications which measures approximately 2.3 x 2.3 x 1.2 cm. Within this region faint calcifications are distributed in a linear arrangement and warrant biopsy to exclude ductal carcinoma in situ.  IMPRESSION: Suspicious left breast calcifications 1 biopsy.  RECOMMENDATION: Stereotactic guided core biopsy is recommended and scheduled for the patient at [facility] on 03/07/2014.  I have discussed the findings and recommendations with the patient. Results were also provided in writing at the conclusion of the visit. If applicable, a reminder letter will be sent to the patient regarding the next appointment.  BI-RADS CATEGORY  4: Suspicious.   Electronically Signed   By: Shon Hale M.D.   On: 02/25/2014 11:29   Mm Lt Breast Bx W Loc Dev 1st Lesion  Image Bx Spec Stereo Guide  03/10/2014   ADDENDUM REPORT: 03/10/2014 13:12  ADDENDUM: After discussion with her primary care provider, the patient has elected to come to [facility] for treatment. She has been referred to [facility] on March 19, 2014.  Reported by Susa Raring RN, BSN on March 10, 2014.   Electronically Signed   By: Evangeline Dakin M.D.   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 This was found to be concordant by Dr. Evangeline Dakin. Pathology was discussed with the patient by telephone. She reported doing well after the biopsy. Post biopsy instructions and care were reviewed and her questions were answered. Surgical consultation has been arranged  with Dr. Aviva Signs on March 20, 2014. A bilateral breast MRI has been scheduled on March 18, 2014. She is encouraged to come to [facility] for educational materials. My number is provided to the patient for future questions and concerns.  Pathology results reported by Susa Raring RN, BSN on March 10, 2014.   Electronically Signed   By: Evangeline Dakin M.D.   On: 03/10/2014 11:32   03/10/2014   CLINICAL DATA:  Screening detected indeterminate group of calcifications, in the upper-outer quadrant of the left breast, anterior depth, spanning approximately 2.5 cm, the most tight group spanning approximately 0.5 cm.  EXAM: LEFT BREAST STEREOTACTIC CORE NEEDLE BIOPSY  COMPARISON:  Previous exams.  FINDINGS: The patient and I discussed the procedure of stereotactic-guided biopsy including benefits and alternatives. We discussed the high likelihood of a successful procedure. We discussed the risks of the procedure including infection, bleeding, tissue injury, clip migration, and inadequate sampling. Informed written consent was given. The usual time out protocol was performed immediately prior to the procedure.  Using sterile technique and 2% Lidocaine as local anesthetic, under stereotactic guidance, a 9 gauge vacuum assisted device was used to perform core needle biopsy of the tight group of calcifications within the 2.5 cm span in the upper outer left breast, anterior depth, using a superior approach. Specimen radiograph was performed showing calcifications in 2 and possibly 3 of the core samples. Specimens with calcifications are identified for pathology.  At the conclusion of the procedure, an X shaped tissue marker clip was deployed into the biopsy cavity. Follow-up 2-view mammogram was performed and is reported separately.  IMPRESSION: Stereotactic-guided biopsy of an indeterminate group of calcifications in the upper outer left breast, anterior depth. No apparent  complications.  Electronically Signed: By: Evangeline Dakin M.D. On: 03/07/2014 15:21    ASSESSMENT & PLAN:  74 year old postmenopausal woman, who was found to have left breast DCIS.  1. Left breast DCIS, ER and PR strongly positive -I reviewed her image and surgical biopsy findings. She has not invasive breast cancer, the treatment intent is curative. -She has met our surgeon Dr. Brantley Stage today, will have lumpectomy shown. Her breast cancer will likely to be cured by complete surgical resection. -We discussed the role of adjuvant hormonal therapy to reduce her risk of cancer recurrence. Giving her postmenopausal status, I recommend aromatase inhibitor, potential side effects and benefit  were discussed with patient and she agrees to proceed. I'll start her after she recovers from surgery or when she completes adjuvant radiation. -She has also met radiation oncologist Dr. Lisbeth Renshaw today and discussed adjuvant radiation.  I plan to see her back  3-4 weeks after her surgery. I'll review her surgical past findings, if her surgical path shows invasive carcinoma, I'll see her sooner after surgery.  All questions were answered. The patient knows to call the clinic with any problems, questions or concerns. I spent 30 minutes counseling the patient face to face. The total time spent in the appointment was 40 minutes and more than 50% was on counseling.     Truitt Merle, MD 03/19/2014 3:10 PM

## 2014-03-19 NOTE — Progress Notes (Signed)
Ms. Marie Gentry is a very pleasant 74 y.o. female from [place], [place] with newly diagnosed grade 1 DCIS of the left breast.  Biopsy results revealed the tumor's hormone status as ER positive and PR positive.  She presents today with her friend to the [facility]) for treatment consideration and recommendations from the breast surgeon, radiation oncologist, and medical oncologist.     I briefly met with Ms. Marie Gentry and her friend during her [facility] visit today. We discussed the purpose of the Survivorship Clinic, which will include monitoring for recurrence, coordinating completion of age and gender-appropriate cancer screenings, promotion of overall wellness, as well as managing potential late/long-term side effects of anti-cancer treatments.    As of today, the treatment plan for Ms. Marie Gentry will likely include surgery and radiation therapy.  Anti-estrogen treatment will be considered for her as well.The intent of treatment for Ms. Marie Gentry is cure, therefore she will be eligible for the Survivorship Clinic upon her completion of treatment.  Her survivorship care plan (SCP) document will be drafted and updated throughout the course of her treatment trajectory. She will receive the SCP in an office visit with myself in the Survivorship Clinic once she has completed treatment.   Ms. Marie Gentry was encouraged to ask questions and all questions were answered to her satisfaction.  She was given my business card and encouraged to contact me with any concerns regarding survivorship.  I look forward to participating in her care.   Mike Craze, NP [place] [phone]

## 2014-03-19 NOTE — Progress Notes (Signed)
Checked in new pt with no financial concerns prior to seeing the dr.  I informed pt if chemo is part of her treatment Josem Kaufmann will probably be required and Raquel will obtain that if it is as well as contact foundations that offer copay assistance if needed.  She has Raquel's card for any billing or insurance questions or concerns.

## 2014-03-21 ENCOUNTER — Encounter: Payer: Self-pay | Admitting: Radiation Oncology

## 2014-03-21 NOTE — Progress Notes (Signed)
Radiation Oncology         [phone] ________________________________  Name: Marie Gentry MRN: [id number]  Date: 03/19/2014  DOB: [birth year]  WU:[id number],YNWGN, MD  Erroll Luna, MD     REFERRING PHYSICIAN: Erroll Luna, MD   DIAGNOSIS: The encounter diagnosis was Breast cancer of upper-outer quadrant of left female breast.  Breast cancer of upper-outer quadrant of left female breast   Staging form: Breast, AJCC 7th Edition     Clinical stage from 03/19/2014: Stage 0 (Tis (DCIS), N0, M0) - Unsigned       Staging comments: Staged at breast conference on 1.20.16     HISTORY OF PRESENT ILLNESS::Marie Gentry is a 74 y.o. 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  The patient presented was originally with suspicious calcifications on screening mammogram. A diagnostic mammogram confirmed this finding and a stereotactic biopsy of this area was performed. The area in question measured 2.3 cm and a clip was placed in this region This returned positive for DCIS. Receptors studies have been performed and the tumor is estrogen receptor positive and progesterone receptor positive.  The patient has undergone an MRI scan of the breasts. This demonstrated post biopsy change within the left breast in the region of biopsy-proven ductal carcinoma in situ. No evidence of abnormal lymphadenopathy was seen.    PREVIOUS RADIATION THERAPY: No   PAST MEDICAL HISTORY:  has a past medical history of Hypertension; Hyperlipidemia; Breast cancer of upper-outer quadrant of left female breast (03/11/2014); and Arthritis.     PAST SURGICAL HISTORY: Past Surgical History  Procedure Laterality Date  . Pulmonary embolism surgery    . Replacement total knee Right 2006  . Anterior fusion cervical spine  1990's  . Colonoscopy  2008    negative  . Cholecystectomy  01/2010    Dr. Arnoldo Morale     FAMILY HISTORY: family  history is not on file.   SOCIAL HISTORY:  reports that she has never smoked. She does not have any smokeless tobacco history on file. She reports that she drinks alcohol. She reports that she does not use illicit drugs.   ALLERGIES: Penicillins and Hctz   MEDICATIONS:  Current Outpatient Prescriptions  Medication Sig Dispense Refill  . atenolol (TENORMIN) 25 MG tablet TAKE 1 TABLET EVERY DAY 90 tablet 1  . citalopram (CELEXA) 20 MG tablet TAKE 1 TABLET EVERY DAY 90 tablet 1  . diclofenac (VOLTAREN) 75 MG EC tablet Take 1 tablet (75 mg total) by mouth daily. (Patient not taking: Reported on 03/19/2014) 90 tablet 1  . indapamide (LOZOL) 1.25 MG tablet Take 1 tablet (1.25 mg total) by mouth daily. (Patient not taking: Reported on 03/19/2014) 90 tablet 1  . losartan (COZAAR) 100 MG tablet TAKE 1 TABLET EVERY MORNING 90 tablet 1  . omeprazole (PRILOSEC OTC) 20 MG tablet Take 1 tablet (20 mg total) by mouth daily. 90 tablet 1  . VITAMIN D, CHOLECALCIFEROL, PO Take by mouth. 2,000 iu     No current facility-administered medications for this encounter.     REVIEW OF SYSTEMS:  A 15 point review of systems is documented in the electronic medical record. This was obtained by the nursing staff. However, I reviewed this with the patient to discuss relevant findings and make appropriate changes.  Pertinent items are noted in HPI.    PHYSICAL EXAM:  vitals were not taken for this visit.  ECOG = 1  0 -  Asymptomatic (Fully active, able to carry on all predisease activities without restriction)  1 - Symptomatic but completely ambulatory (Restricted in physically strenuous activity but ambulatory and able to carry out work of a light or sedentary nature. For example, light housework, office work)  2 - Symptomatic, <50% in bed during the day (Ambulatory and capable of all self care but unable to carry out any work activities. Up and about more than 50% of waking hours)  3 - Symptomatic, >50% in bed,  but not bedbound (Capable of only limited self-care, confined to bed or chair 50% or more of waking hours)  4 - Bedbound (Completely disabled. Cannot carry on any self-care. Totally confined to bed or chair)  5 - Death   Marie Gentry, Marie Gentry, Marie Gentry, et al. [phone]). "Toxicity and response criteria of the [facility] Group". [place] Oncol. 5 (6): 649-55  General: Well-developed, in no acute distress HEENT: Normocephalic, atraumatic; oral cavity clear Neck: Supple without any lymphadenopathy Cardiovascular: Regular rate and rhythm Respiratory: Clear to auscultation bilaterally Breasts: Some bruising is present in the upper inner quadrant of the left breast. No discrete nodule palpated. No axillary adenopathy. GI: Soft, nontender, normal bowel sounds Extremities: No edema present Neuro: No focal deficits     LABORATORY DATA:  Lab Results  Component Value Date   WBC 4.9 03/19/2014   HGB 13.4 03/19/2014   HCT 40.9 03/19/2014   MCV 94.7 03/19/2014   PLT 180 03/19/2014   Lab Results  Component Value Date   NA 138 03/19/2014   K 4.1 03/19/2014   CL 102 03/19/2013   CO2 28 03/19/2014   Lab Results  Component Value Date   ALT 20 03/19/2014   AST 19 03/19/2014   ALKPHOS 59 03/19/2014   BILITOT 0.39 03/19/2014      RADIOGRAPHY: Mr Breast Bilateral W Wo Contrast  03/18/2014   CLINICAL DATA:  75 year old female with newly diagnosed left breast DCIS.  LABS:  BUN and creatinine were obtained on site at [place] at  [address]  Results:  BUN 25 mg/dL,  Creatinine 1.2 mg/dL.  EXAM: BILATERAL BREAST MRI WITH AND WITHOUT CONTRAST  TECHNIQUE: Multiplanar, multisequence MR images of both breasts were obtained prior to and following the intravenous administration of 91m of MultiHance.  THREE-DIMENSIONAL MR IMAGE RENDERING ON INDEPENDENT WORKSTATION:  Three-dimensional MR images were rendered by post-processing of the original MR data on an independent  workstation. The three-dimensional MR images were interpreted, and findings are reported in the following complete MRI report for this study. Three dimensional images were evaluated at the independent DynaCad workstation  COMPARISON:  Previous exams  FINDINGS: Breast composition: b.  Scattered fibroglandular tissue.  Background parenchymal enhancement: Mild  Right breast: No mass or abnormal enhancement.  Left breast: Biopsy clip artifact and post biopsy changes within the anterior upper left breast are identified in the area of biopsy-proven DCIS. No other abnormal areas of enhancement are identified.  Lymph nodes: No abnormal appearing lymph nodes.  Ancillary findings:  A large hiatal hernia is noted.  IMPRESSION: Biopsy clip artifact and biopsy changes within the anterior upper left breast in the area of biopsy-proven DCIS. Please note that there are remaining calcifications identified mammographically that lie 2 cm medial and 1.5 cm inferior to the biopsy clip. Consider wire localization and bracketing of the clip and remaining calcifications if breast conservation surgery is performed.  No evidence of multicentric or contralateral malignancy. No enlarged or  abnormal appearing lymph nodes.  Large hiatal hernia.  RECOMMENDATION: Treatment plan  BI-RADS CATEGORY  6: Known biopsy-proven malignancy.   Electronically Signed   By: Hassan Rowan M.D.   On: 03/18/2014 12:27   Gentry Digital Diagnostic Unilat L  03/07/2014   CLINICAL DATA:  Confirmation of clip placement after stereotactic core needle biopsy of indeterminate calcifications in the upper-outer quadrant of the left breast, anterior depth.  EXAM: DIAGNOSTIC LEFT MAMMOGRAM POST STEREOTACTIC BIOPSY  COMPARISON:  Previous exams.  FINDINGS: Mammographic images were obtained following stereotactic guided biopsy of calcifications in the upper outer left breast, anterior depth. The X shaped tissue marker clip is appropriately positioned at the site of the tight group of  calcifications which were biopsied. Most if not all of these calcifications were removed at the time of biopsy. Expected post biopsy changes are present without evidence of hematoma.  IMPRESSION: Appropriate positioning of the X shaped tissue marker clip in the upper-outer left breast, anterior depth, at the site of the calcifications.  Final Assessment: Post Procedure Mammograms for Marker Placement   Electronically Signed   By: Evangeline Dakin M.D.   On: 03/07/2014 15:27   Gentry Digital Diagnostic Unilat L  02/25/2014   CLINICAL DATA:  The patient returns after screening study for evaluation of left breast calcifications.  EXAM: DIGITAL DIAGNOSTIC  LEFT MAMMOGRAM  COMPARISON:  02/06/2014  ACR Breast Density Category b: There are scattered areas of fibroglandular density.  FINDINGS: Magnified views are performed of calcifications in the 12:30 o'clock retroareolar region of the left breast. In this region there is a group of calcifications which measures approximately 2.3 x 2.3 x 1.2 cm. Within this region faint calcifications are distributed in a linear arrangement and warrant biopsy to exclude ductal carcinoma in situ.  IMPRESSION: Suspicious left breast calcifications 1 biopsy.  RECOMMENDATION: Stereotactic guided core biopsy is recommended and scheduled for the patient at [facility] on 03/07/2014.  I have discussed the findings and recommendations with the patient. Results were also provided in writing at the conclusion of the visit. If applicable, a reminder letter will be sent to the patient regarding the next appointment.  BI-RADS CATEGORY  4: Suspicious.   Electronically Signed   By: Shon Hale M.D.   On: 02/25/2014 11:29   Gentry Lt Breast Bx W Loc Dev 1st Lesion Image Bx Spec Stereo Guide  03/10/2014   ADDENDUM REPORT: 03/10/2014 13:12  ADDENDUM: After discussion with her primary care provider, the patient has elected to come to [facility] for treatment. She has been referred  to [facility] on March 19, 2014.  Reported by Susa Raring RN, BSN on March 10, 2014.   Electronically Signed   By: Evangeline Dakin M.D.   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 This was found to be concordant by Dr. Evangeline Dakin. Pathology was discussed with the patient by telephone. She reported doing well after the biopsy. Post biopsy instructions and care were reviewed and her questions were answered. Surgical consultation has been arranged with Dr. Aviva Signs on March 20, 2014. A bilateral breast MRI has been scheduled on March 18, 2014. She is encouraged to come to [facility] for educational materials. My number is provided to the patient for future questions and concerns.  Pathology results reported by  Susa Raring RN, BSN on March 10, 2014.   Electronically Signed   By: Evangeline Dakin M.D.   On: 03/10/2014 11:32   03/10/2014   CLINICAL DATA:  Screening detected indeterminate group of calcifications, in the upper-outer quadrant of the left breast, anterior depth, spanning approximately 2.5 cm, the most tight group spanning approximately 0.5 cm.  EXAM: LEFT BREAST STEREOTACTIC CORE NEEDLE BIOPSY  COMPARISON:  Previous exams.  FINDINGS: The patient and I discussed the procedure of stereotactic-guided biopsy including benefits and alternatives. We discussed the high likelihood of a successful procedure. We discussed the risks of the procedure including infection, bleeding, tissue injury, clip migration, and inadequate sampling. Informed written consent was given. The usual time out protocol was performed immediately prior to the procedure.  Using sterile technique and 2% Lidocaine as local anesthetic, under stereotactic guidance, a 9 gauge vacuum assisted device was used to perform core needle  biopsy of the tight group of calcifications within the 2.5 cm span in the upper outer left breast, anterior depth, using a superior approach. Specimen radiograph was performed showing calcifications in 2 and possibly 3 of the core samples. Specimens with calcifications are identified for pathology.  At the conclusion of the procedure, an X shaped tissue marker clip was deployed into the biopsy cavity. Follow-up 2-view mammogram was performed and is reported separately.  IMPRESSION: Stereotactic-guided biopsy of an indeterminate group of calcifications in the upper outer left breast, anterior depth. No apparent complications.  Electronically Signed: By: Evangeline Dakin M.D. On: 03/07/2014 15:21       IMPRESSION:    Breast cancer of upper-outer quadrant of left female breast   03/07/2014 Pathology Results Breast, left, needle core biopsy, upper outer quadrant, anterior depth - DUCTAL CARCINOMA IN SITU WITH CALCIFICATIONS, SEE COMMENT   03/11/2014 Initial Diagnosis Breast cancer of upper-outer quadrant of left female breast   03/18/2014 Breast MRI Biopsy clip artifact and biopsy changes within the anterior upper left breast in the area of biopsy-proven DCIS. Please note that there are remaining calcifications identified mammographically that lie 2 cm medial and 1.5 cm inferior to the biops   03/18/2014 Receptors her2 ER/PR+    The patient has a recent diagnosis of DCIS of the left breast. She appears to be a good candidate for breast conservation treatment.  I discussed with the patient the role of adjuvant radiation treatment in this setting. We discussed the potential benefit of radiation treatment, especially with regards to local control of the patient's tumor. We also discussed the possible side effects and risks of such a treatment as well.  All of the patient's questions were answered. The patient wishes to proceed with radiation treatment at the appropriate time.  PLAN: I look forward to seeing  the patient postoperatively to review her case and further discuss and coordinate an anticipated course of radiation treatment.    I spent 60 minutes face to face with the patient and more than 50% of that time was spent in counseling and/or coordination of care.    ________________________________   Jodelle Gross, MD, PhD   **Disclaimer: This note was dictated with voice recognition software. Similar sounding words can inadvertently be transcribed and this note may contain transcription errors which may not have been corrected upon publication of note.**

## 2014-03-23 ENCOUNTER — Telehealth: Payer: Self-pay | Admitting: Hematology

## 2014-03-23 NOTE — Telephone Encounter (Signed)
Her CMP on 1/20 showed Cr 1.2, increased from 0.78 one year ago. Her BP was also high on her visit with [place]. I called pt and left a message. I recommended her to increase po fluids intake, and follow up with her PCP to repeat her BMP in a few weeks.   Truitt Merle  03/23/2014

## 2014-03-25 ENCOUNTER — Telehealth: Payer: Self-pay | Admitting: *Deleted

## 2014-03-25 NOTE — Telephone Encounter (Signed)
Spoke to pt concerning Marie Gentry from 03/19/14. Pt denies questions or concerns regarding dx or treatment care plan. Pt waiting on call from Dr. Josetta Huddle office to schedule surgery. Informed pt that I have reached out to his office. Informed pt once we have surgery date that I will get her an appt with Dr. Burr Medico for f/u. Received verbal understanding. Encourage pt to call with needs. Contact information given.

## 2014-03-26 ENCOUNTER — Encounter: Payer: Self-pay | Admitting: General Practice

## 2014-03-26 NOTE — Progress Notes (Signed)
[facility] Psychosocial Distress Screening Spiritual Care   Met with ms Cornick and her friend Juliann Pulse (a survivor herself) to introduce [facility] team/resources, and to review distress screen per protocol.  The patient scored a 4 on the Psychosocial Distress Thermometer which indicates moderate distress. Provided spiritual/emotional support and assessed for distress and other psychosocial needs.   ONCBCN DISTRESS SCREENING 03/26/2014  Screening Type Initial Screening  Distress experienced in past week (1-10) 4  Emotional problem type Nervousness/Anxiety  Spiritual/Religous concerns type Relating to God  Referral to support programs Yes  Other Spiritual Care   Ms Dunning reports good support from family, friends, neighbors, and [place].  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  That her friend she brought as a support person has recently completed breast ca tx at [facility] was very meaningful to her.  Follow up needed: No.  Provided Support Center print materials and introduction to/contact info for team members.  Please also page as needs arise.  Thank you.  [place], [place]

## 2014-03-27 ENCOUNTER — Other Ambulatory Visit: Payer: Self-pay | Admitting: Surgery

## 2014-03-27 DIAGNOSIS — D0512 Intraductal carcinoma in situ of left breast: Secondary | ICD-10-CM

## 2014-03-28 ENCOUNTER — Other Ambulatory Visit: Payer: Self-pay | Admitting: Surgery

## 2014-03-28 DIAGNOSIS — D0512 Intraductal carcinoma in situ of left breast: Secondary | ICD-10-CM

## 2014-03-31 ENCOUNTER — Telehealth: Payer: Self-pay | Admitting: *Deleted

## 2014-03-31 NOTE — Telephone Encounter (Signed)
Spoke with patient and scheduled and confirmed follow up appointment with Dr. Burr Medico for 05/12/14 at 1pm.  Encouraged patient to call with any needs or concerns.

## 2014-04-03 ENCOUNTER — Telehealth: Payer: Self-pay | Admitting: Family Medicine

## 2014-04-03 NOTE — Telephone Encounter (Signed)
Give letter please

## 2014-04-03 NOTE — Telephone Encounter (Signed)
Pt cannot attend jury duty d/t cancer tx.

## 2014-04-03 NOTE — Telephone Encounter (Signed)
Pt is needing a note stating that she is unable to attend jury duty.

## 2014-04-03 NOTE — Telephone Encounter (Signed)
Patient notified

## 2014-04-10 ENCOUNTER — Encounter (HOSPITAL_BASED_OUTPATIENT_CLINIC_OR_DEPARTMENT_OTHER): Payer: Self-pay | Admitting: *Deleted

## 2014-04-10 NOTE — Progress Notes (Signed)
Since last labs bun/creat elevated from nsaid will reck after seeds-need ekg also

## 2014-04-15 ENCOUNTER — Ambulatory Visit
Admission: RE | Admit: 2014-04-15 | Discharge: 2014-04-15 | Disposition: A | Discharge status: Home / Self Care | Payer: Medicare PPO | Source: Ambulatory Visit | Attending: Surgery | Admitting: Surgery

## 2014-04-15 ENCOUNTER — Encounter (HOSPITAL_BASED_OUTPATIENT_CLINIC_OR_DEPARTMENT_OTHER)
Admission: RE | Admit: 2014-04-15 | Discharge: 2014-04-15 | Disposition: A | Discharge status: Home / Self Care | Payer: Medicare PPO | Source: Ambulatory Visit | Attending: Surgery | Admitting: Surgery

## 2014-04-15 DIAGNOSIS — D0512 Intraductal carcinoma in situ of left breast: Secondary | ICD-10-CM | POA: Diagnosis not present

## 2014-04-15 DIAGNOSIS — F1590 Other stimulant use, unspecified, uncomplicated: Secondary | ICD-10-CM | POA: Diagnosis not present

## 2014-04-15 DIAGNOSIS — E780 Pure hypercholesterolemia: Secondary | ICD-10-CM | POA: Diagnosis not present

## 2014-04-15 DIAGNOSIS — F1099 Alcohol use, unspecified with unspecified alcohol-induced disorder: Secondary | ICD-10-CM | POA: Diagnosis not present

## 2014-04-15 DIAGNOSIS — K219 Gastro-esophageal reflux disease without esophagitis: Secondary | ICD-10-CM | POA: Diagnosis not present

## 2014-04-15 DIAGNOSIS — I10 Essential (primary) hypertension: Secondary | ICD-10-CM | POA: Diagnosis not present

## 2014-04-15 DIAGNOSIS — Z86711 Personal history of pulmonary embolism: Secondary | ICD-10-CM | POA: Diagnosis not present

## 2014-04-17 ENCOUNTER — Ambulatory Visit (HOSPITAL_BASED_OUTPATIENT_CLINIC_OR_DEPARTMENT_OTHER): Payer: Medicare PPO | Admitting: Certified Registered"

## 2014-04-17 ENCOUNTER — Ambulatory Visit (HOSPITAL_BASED_OUTPATIENT_CLINIC_OR_DEPARTMENT_OTHER)
Admission: RE | Admit: 2014-04-17 | Discharge: 2014-04-17 | Disposition: A | Discharge status: Home / Self Care | Payer: Medicare PPO | Source: Ambulatory Visit | Attending: Surgery | Admitting: Surgery

## 2014-04-17 ENCOUNTER — Encounter (HOSPITAL_BASED_OUTPATIENT_CLINIC_OR_DEPARTMENT_OTHER)
Admission: RE | Disposition: A | Discharge status: Home / Self Care | Payer: Self-pay | Source: Ambulatory Visit | Attending: Surgery

## 2014-04-17 ENCOUNTER — Ambulatory Visit
Admission: RE | Admit: 2014-04-17 | Discharge: 2014-04-17 | Disposition: A | Discharge status: Home / Self Care | Payer: Medicare PPO | Source: Ambulatory Visit | Attending: Surgery | Admitting: Surgery

## 2014-04-17 ENCOUNTER — Encounter (HOSPITAL_BASED_OUTPATIENT_CLINIC_OR_DEPARTMENT_OTHER): Payer: Self-pay | Admitting: *Deleted

## 2014-04-17 DIAGNOSIS — Z86711 Personal history of pulmonary embolism: Secondary | ICD-10-CM | POA: Insufficient documentation

## 2014-04-17 DIAGNOSIS — I10 Essential (primary) hypertension: Secondary | ICD-10-CM | POA: Insufficient documentation

## 2014-04-17 DIAGNOSIS — D0512 Intraductal carcinoma in situ of left breast: Secondary | ICD-10-CM

## 2014-04-17 DIAGNOSIS — K219 Gastro-esophageal reflux disease without esophagitis: Secondary | ICD-10-CM | POA: Diagnosis not present

## 2014-04-17 DIAGNOSIS — E780 Pure hypercholesterolemia: Secondary | ICD-10-CM | POA: Diagnosis not present

## 2014-04-17 DIAGNOSIS — F1590 Other stimulant use, unspecified, uncomplicated: Secondary | ICD-10-CM | POA: Insufficient documentation

## 2014-04-17 DIAGNOSIS — F1099 Alcohol use, unspecified with unspecified alcohol-induced disorder: Secondary | ICD-10-CM | POA: Insufficient documentation

## 2014-04-17 HISTORY — DX: Personal history of other venous thrombosis and embolism: Z86.718

## 2014-04-17 HISTORY — PX: BREAST LUMPECTOMY WITH RADIOACTIVE SEED LOCALIZATION: SHX6424

## 2014-04-17 HISTORY — DX: Other specified postprocedural states: Z98.890

## 2014-04-17 HISTORY — DX: Presence of dental prosthetic device (complete) (partial): Z97.2

## 2014-04-17 HISTORY — DX: Presence of spectacles and contact lenses: Z97.3

## 2014-04-17 HISTORY — DX: Personal history of other specified conditions: Z87.898

## 2014-04-17 HISTORY — DX: Gastro-esophageal reflux disease without esophagitis: K21.9

## 2014-04-17 HISTORY — DX: Nausea with vomiting, unspecified: R11.2

## 2014-04-17 SURGERY — BREAST LUMPECTOMY WITH RADIOACTIVE SEED LOCALIZATION
Anesthesia: General | Site: Breast | Laterality: Left

## 2014-04-17 MED ORDER — BUPIVACAINE-EPINEPHRINE (PF) 0.25% -1:200000 IJ SOLN
INTRAMUSCULAR | Status: AC
  Filled 2014-04-17: qty 60

## 2014-04-17 MED ORDER — DEXAMETHASONE SODIUM PHOSPHATE 4 MG/ML IJ SOLN
INTRAMUSCULAR | Status: DC | PRN
  Administered 2014-04-17: 10 mg via INTRAVENOUS

## 2014-04-17 MED ORDER — OXYCODONE-ACETAMINOPHEN 5-325 MG PO TABS
1.0000 | ORAL_TABLET | Freq: Once | ORAL | Status: AC
  Administered 2014-04-17: 1 via ORAL

## 2014-04-17 MED ORDER — PROPOFOL 10 MG/ML IV BOLUS
INTRAVENOUS | Status: DC | PRN
  Administered 2014-04-17: 150 mg via INTRAVENOUS

## 2014-04-17 MED ORDER — ONDANSETRON HCL 4 MG/2ML IJ SOLN
INTRAMUSCULAR | Status: DC | PRN
  Administered 2014-04-17: 4 mg via INTRAVENOUS

## 2014-04-17 MED ORDER — OXYCODONE-ACETAMINOPHEN 5-325 MG PO TABS: 1.0000 | ORAL_TABLET | ORAL | Status: DC | PRN

## 2014-04-17 MED ORDER — LACTATED RINGERS IV SOLN
INTRAVENOUS | Status: DC | PRN
  Administered 2014-04-17 (×2): via INTRAVENOUS

## 2014-04-17 MED ORDER — MEPERIDINE HCL 25 MG/ML IJ SOLN: 6.2500 mg | INTRAMUSCULAR | Status: DC | PRN

## 2014-04-17 MED ORDER — OXYCODONE-ACETAMINOPHEN 5-325 MG PO TABS
ORAL_TABLET | ORAL | Status: AC
  Filled 2014-04-17: qty 1

## 2014-04-17 MED ORDER — LIDOCAINE HCL (CARDIAC) 20 MG/ML IV SOLN
INTRAVENOUS | Status: DC | PRN
  Administered 2014-04-17: 60 mg via INTRAVENOUS

## 2014-04-17 MED ORDER — BUPIVACAINE-EPINEPHRINE (PF) 0.25% -1:200000 IJ SOLN
INTRAMUSCULAR | Status: DC | PRN
  Administered 2014-04-17: 10 mL

## 2014-04-17 MED ORDER — LACTATED RINGERS IV SOLN
INTRAVENOUS | Status: DC
  Administered 2014-04-17: 11:00:00 via INTRAVENOUS

## 2014-04-17 MED ORDER — CHLORHEXIDINE GLUCONATE 4 % EX LIQD: 1.0000 "application " | Freq: Once | CUTANEOUS | Status: DC

## 2014-04-17 MED ORDER — FENTANYL CITRATE 0.05 MG/ML IJ SOLN
INTRAMUSCULAR | Status: AC
  Filled 2014-04-17: qty 6

## 2014-04-17 MED ORDER — CLINDAMYCIN PHOSPHATE 600 MG/50ML IV SOLN
INTRAVENOUS | Status: AC
  Filled 2014-04-17: qty 50

## 2014-04-17 MED ORDER — CLINDAMYCIN PHOSPHATE 600 MG/50ML IV SOLN
600.0000 mg | Freq: Once | INTRAVENOUS | Status: AC
  Administered 2014-04-17: 600 mg via INTRAVENOUS

## 2014-04-17 MED ORDER — MIDAZOLAM HCL 2 MG/2ML IJ SOLN: 1.0000 mg | INTRAMUSCULAR | Status: DC | PRN

## 2014-04-17 MED ORDER — FENTANYL CITRATE 0.05 MG/ML IJ SOLN
INTRAMUSCULAR | Status: DC | PRN
  Administered 2014-04-17: 25 ug via INTRAVENOUS
  Administered 2014-04-17: 50 ug via INTRAVENOUS

## 2014-04-17 MED ORDER — FENTANYL CITRATE 0.05 MG/ML IJ SOLN: 50.0000 ug | INTRAMUSCULAR | Status: DC | PRN

## 2014-04-17 MED ORDER — ONDANSETRON HCL 4 MG/2ML IJ SOLN: 4.0000 mg | Freq: Once | INTRAMUSCULAR | Status: DC | PRN

## 2014-04-17 MED ORDER — EPHEDRINE SULFATE 50 MG/ML IJ SOLN
INTRAMUSCULAR | Status: DC | PRN
  Administered 2014-04-17 (×2): 10 mg via INTRAVENOUS

## 2014-04-17 MED ORDER — HYDROMORPHONE HCL 1 MG/ML IJ SOLN: 0.2500 mg | INTRAMUSCULAR | Status: DC | PRN

## 2014-04-17 SURGICAL SUPPLY — 48 items
APPLIER CLIP 9.375 MED OPEN (MISCELLANEOUS) ×2
BINDER BREAST LRG (GAUZE/BANDAGES/DRESSINGS) IMPLANT
BINDER BREAST MEDIUM (GAUZE/BANDAGES/DRESSINGS) IMPLANT
BINDER BREAST XLRG (GAUZE/BANDAGES/DRESSINGS) ×2 IMPLANT
BINDER BREAST XXLRG (GAUZE/BANDAGES/DRESSINGS) IMPLANT
BLADE SURG 15 STRL LF DISP TIS (BLADE) ×1 IMPLANT
BLADE SURG 15 STRL SS (BLADE) ×1
CANISTER SUC SOCK COL 7IN (MISCELLANEOUS) ×2 IMPLANT
CANISTER SUCT 1200ML W/VALVE (MISCELLANEOUS) IMPLANT
CHLORAPREP W/TINT 26ML (MISCELLANEOUS) ×2 IMPLANT
CLIP APPLIE 9.375 MED OPEN (MISCELLANEOUS) ×1 IMPLANT
CLIP TI WIDE RED SMALL 6 (CLIP) ×2 IMPLANT
COVER BACK TABLE 60X90IN (DRAPES) ×2 IMPLANT
COVER MAYO STAND STRL (DRAPES) ×2 IMPLANT
COVER PROBE W GEL 5X96 (DRAPES) ×2 IMPLANT
DECANTER SPIKE VIAL GLASS SM (MISCELLANEOUS) IMPLANT
DEVICE DUBIN W/COMP PLATE 8390 (MISCELLANEOUS) ×2 IMPLANT
DRAPE LAPAROSCOPIC ABDOMINAL (DRAPES) IMPLANT
DRAPE LAPAROTOMY 100X72 PEDS (DRAPES) ×2 IMPLANT
DRAPE UTILITY XL STRL (DRAPES) ×2 IMPLANT
ELECT COATED BLADE 2.86 ST (ELECTRODE) ×2 IMPLANT
ELECT REM PT RETURN 9FT ADLT (ELECTROSURGICAL) ×2
ELECTRODE REM PT RTRN 9FT ADLT (ELECTROSURGICAL) ×1 IMPLANT
GLOVE BIOGEL PI IND STRL 7.5 (GLOVE) ×1 IMPLANT
GLOVE BIOGEL PI IND STRL 8 (GLOVE) ×1 IMPLANT
GLOVE BIOGEL PI INDICATOR 7.5 (GLOVE) ×1
GLOVE BIOGEL PI INDICATOR 8 (GLOVE) ×1
GLOVE ECLIPSE 8.0 STRL XLNG CF (GLOVE) ×2 IMPLANT
GLOVE SURG SS PI 7.5 STRL IVOR (GLOVE) ×4 IMPLANT
GOWN STRL REUS W/ TWL LRG LVL3 (GOWN DISPOSABLE) ×2 IMPLANT
GOWN STRL REUS W/TWL LRG LVL3 (GOWN DISPOSABLE) ×2
HEMOSTAT SNOW SURGICEL 2X4 (HEMOSTASIS) IMPLANT
KIT MARKER MARGIN INK (KITS) ×2 IMPLANT
LIQUID BAND (GAUZE/BANDAGES/DRESSINGS) ×2 IMPLANT
NEEDLE HYPO 25X1 1.5 SAFETY (NEEDLE) ×2 IMPLANT
NS IRRIG 1000ML POUR BTL (IV SOLUTION) ×2 IMPLANT
PACK BASIN DAY SURGERY FS (CUSTOM PROCEDURE TRAY) ×2 IMPLANT
PENCIL BUTTON HOLSTER BLD 10FT (ELECTRODE) ×2 IMPLANT
SLEEVE SCD COMPRESS KNEE MED (MISCELLANEOUS) ×2 IMPLANT
SPONGE LAP 4X18 X RAY DECT (DISPOSABLE) ×2 IMPLANT
SUT MNCRL AB 4-0 PS2 18 (SUTURE) ×2 IMPLANT
SUT SILK 2 0 SH (SUTURE) IMPLANT
SUT VICRYL 3-0 CR8 SH (SUTURE) ×2 IMPLANT
SYR CONTROL 10ML LL (SYRINGE) ×2 IMPLANT
TOWEL OR 17X24 6PK STRL BLUE (TOWEL DISPOSABLE) ×2 IMPLANT
TOWEL OR NON WOVEN STRL DISP B (DISPOSABLE) ×2 IMPLANT
TUBE CONNECTING 20X1/4 (TUBING) IMPLANT
YANKAUER SUCT BULB TIP NO VENT (SUCTIONS) IMPLANT

## 2014-04-17 NOTE — Op Note (Signed)
Preoperative diagnosis: DCIS left breast central   Postoperative diagnosis: Same     Procedure: Left breast seed localized lumpectomy   Surgeon: Erroll Luna M.D.     Anesthesia: LMA with 0.25 % bupivicaine with epinephrine   EBL: 20 cc    Specimen: Left breast mass with clip and seed to pathology   Drains: None   Indications for procedure: Patient presents for treatment of her left DCIS . She has opted for breast conservation after lengthy discussion of treatment options to include breast conservation surgery and mastectomy and reconstruction. Risks, benefits and alternatives discussed with the patient.The procedure has been discussed with the patient. Alternatives to surgery have been discussed with the patient. Risks of surgery include bleeding, Infection, Seroma formation, death, and the need for further surgery. The patient understands and wishes to proceed.Sentinel lymph node mapping and dissection has been discussed with the patient. Risk of bleeding, Infection, Seroma formation, Additional procedures,, Shoulder weakness , Shoulder stiffness, Nerve and blood vessel injury and reaction to the mapping dyes have been discussed. Alternatives to surgery have been discussed with the patient. The patient agrees to proceed.        Description of procedure: Patient underwent placement of left breast need a radiology earlier in the week. She presents to the holding area and questions are answered. Neoprobe was used to verify clip placement in the left breast.. Questions answered. Patient taken back to operating room and placed supine on the operating room table. Patient received abx. After induction of LMA anesthesia left breast was prepped and draped in a sterile fashion. Marland Kitchen Neoprobe was used to identify the radioactive seen in the left central breast. Curvilinear incision made and dissection was carried around to excise all tissue around both the clip and seen. Radiograph showed the  mass with gross negative margins. Both seed  and clip were in the specimen. Specimen sent to pathology.  [place]  Margins were taken as well. The wound was irrigated and closed with 3-0 Vicryl and 4-0 Monocryl. Dermabond applied. All final counts sponge, needle instruments found to be correct at this point. Patient awoke, taken to recovery in satisfactory condition.

## 2014-04-17 NOTE — Interval H&P Note (Signed)
History and Physical Interval Note:  04/17/2014 10:45 AM  Marie Gentry  has presented today for surgery, with the diagnosis of DCIS Left Breast  The various methods of treatment have been discussed with the patient and family. After consideration of risks, benefits and other options for treatment, the patient has consented to  Procedure(s): LEFT BREAST LUMPECTOMY WITH RADIOACTIVE SEED LOCALIZATION (Left) as a surgical intervention .  The patient's history has been reviewed, patient examined, no change in status, stable for surgery.  I have reviewed the patient's chart and labs.  Questions were answered to the patient's satisfaction.     Conita Amenta A.

## 2014-04-17 NOTE — Transfer of Care (Signed)
Immediate Anesthesia Transfer of Care Note  Patient: Marie Gentry  Procedure(s) Performed: Procedure(s): LEFT BREAST LUMPECTOMY WITH RADIOACTIVE SEED LOCALIZATION (Left)  Patient Location: PACU  Anesthesia Type:General  Level of Consciousness: awake and patient cooperative  Airway & Oxygen Therapy: Patient Spontanous Breathing and Patient connected to face mask oxygen  Post-op Assessment: Report given to RN and Post -op Vital signs reviewed and stable  Post vital signs: Reviewed and stable  Last Vitals:  Filed Vitals:   04/17/14 1013  BP: 140/87  Pulse: 60  Temp: 36.7 C  Resp: 18    Complications: No apparent anesthesia complications

## 2014-04-17 NOTE — H&P (View-Only) (Signed)
Marie Gentry 03/19/2014 8:48 AM Location: [facility] Patient #: [id number] DOB: Oct 19, 1940 Undefined / Language: Marie Gentry / Race: Undefined Female History of Present Illness Marie Gentry A. Garielle Mroz MD; 03/19/2014 3:28 PM) Patient words: pt CLINICAL DATA: Confirmation of clip placement after stereotactic core needle biopsy of indeterminate calcifications in the upper-outer quadrant of the left breast, anterior depth.  EXAM: DIAGNOSTIC LEFT MAMMOGRAM POST STEREOTACTIC BIOPSY  COMPARISON: Previous exams.  FINDINGS: Mammographic images were obtained following stereotactic guided biopsy of calcifications in the upper outer left breast, anterior depth. The X shaped tissue marker clip is appropriately positioned at the site of the tight group of calcifications which were biopsied. Most if not all of these calcifications were removed at the time of biopsy. Expected post biopsy changes are present without evidence of hematoma.  IMPRESSION: Appropriate positioning of the X shaped tissue marker clip in the upper-outer left breast, anterior depth, at the site of the calcifications.  Final Assessment: Post Procedure Mammograms for Marker Placement Breast, left, needle core biopsy, upper outer quadrant, anterior depth - DUCTAL CARCINOMA IN SITU WITH CALCIFICATIONS, SEE COMMENThF DR MOODY FOR LEFT BREAST lllll.  The patient is a 74 year old female who presents with breast cancer. 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 The patient was referred by a specialty consultant. Initial presentation was for an abnormal mammogram. Other Problems [name], RN; 03/19/2014 8:48 AM) Arthritis Breast Cancer Cholelithiasis Gastroesophageal Reflux Disease General anesthesia - complications Heart murmur High blood pressure Hypercholesterolemia Pulmonary Embolism / Blood Clot in Legs  Past Surgical History  [name], RN; 03/19/2014 8:48 AM) Breast Biopsy Left. Foot Surgery Right. Gallbladder Surgery - Laparoscopic Knee Surgery Right. Oral Surgery Spinal Surgery - Neck Tonsillectomy  Diagnostic Studies History [name], RN; 03/19/2014 8:48 AM) Colonoscopy 5-10 years ago Mammogram within last year Pap Smear >5 years ago  Social History [name], RN; 03/19/2014 8:48 AM) Alcohol use Occasional alcohol use. Caffeine use Coffee. No drug use Tobacco use Never smoker.  Family History [name], RN; 03/19/2014 8:48 AM) Alcohol Abuse Father. Arthritis Father. Cerebrovascular Accident Father. Colon Polyps Mother. Heart Disease Father. Hypertension Father, Mother. Respiratory Condition Father.  Pregnancy / Birth History [name], RN; 03/19/2014 8:48 AM) Age at menarche 57 years. Age of menopause 58-55 Gravida 4 Irregular periods Maternal age 28-25 Para 3     Review of Systems [name] RN; 03/19/2014 8:48 AM) General Present- Night Sweats. Not Present- Appetite Loss, Chills, Fatigue, Fever, Weight Gain and Weight Loss. Skin Not Present- Change in Wart/Mole, Dryness, Hives, Jaundice, New Lesions, Non-Healing Wounds, Rash and Ulcer. HEENT Present- Seasonal Allergies and Wears glasses/contact lenses. Not Present- Earache, Hearing Loss, Hoarseness, Nose Bleed, Oral Ulcers, Ringing in the Ears, Sinus Pain, Sore Throat, Visual Disturbances and Yellow Eyes. Respiratory Present- Snoring. Not Present- Bloody sputum, Chronic Cough, Difficulty Breathing and Wheezing. Breast Not Present- Breast Mass, Breast Pain, Nipple Discharge and Skin Changes. Cardiovascular Present- Palpitations. Not Present- Chest Pain, Difficulty Breathing Lying Down, Leg Cramps, Rapid Heart Rate, Shortness of Breath and Swelling of Extremities. Gastrointestinal Present- Gets full quickly at meals and Indigestion. Not Present- Abdominal Pain, Bloating, Bloody Stool,  Change in Bowel Habits, Chronic diarrhea, Constipation, Difficulty Swallowing, Excessive gas, Hemorrhoids, Nausea, Rectal Pain and Vomiting. Female Genitourinary Not Present- Frequency, Nocturia, Painful Urination, Pelvic Pain and Urgency. Neurological Present- Decreased Memory and Headaches. Not Present- Fainting, Numbness, Seizures, Tingling, Tremor, Trouble walking and Weakness. Psychiatric  Not Present- Anxiety, Bipolar, Change in Sleep Pattern, Depression, Fearful and Frequent crying. Endocrine Present- Cold Intolerance. Not Present- Excessive Hunger, Hair Changes, Heat Intolerance, Hot flashes and New Diabetes. Hematology Not Present- Easy Bruising, Excessive bleeding, Gland problems, HIV and Persistent Infections.   Physical Exam (Tasnia Spegal A. Benedicto Capozzi MD; 03/19/2014 3:28 PM)  General Mental Status-Alert. General Appearance-Consistent with stated age. Hydration-Well hydrated. Voice-Normal.  Head and Neck Head-normocephalic, atraumatic with no lesions or palpable masses. Trachea-midline. Thyroid Gland Characteristics - normal size and consistency.  Eye Eyeball - Bilateral-Extraocular movements intact. Sclera/Conjunctiva - Bilateral-No scleral icterus.  Chest and Lung Exam Chest and lung exam reveals -quiet, even and easy respiratory effort with no use of accessory muscles and on auscultation, normal breath sounds, no adventitious sounds and normal vocal resonance. Inspection Chest Wall - Normal. Back - normal.  Breast Breast - Left-Symmetric, Non Tender, No Biopsy scars, no Dimpling, No Inflammation, No Lumpectomy scars, No Mastectomy scars, No Peau d' Orange. Breast - Right-Symmetric, Non Tender, No Biopsy scars, no Dimpling, No Inflammation, No Lumpectomy scars, No Mastectomy scars, No Peau d' Orange. Breast Lump-No Palpable Breast Mass.  Cardiovascular Cardiovascular examination reveals -normal heart sounds, regular rate and rhythm with no murmurs and  normal pedal pulses bilaterally.  Abdomen Inspection Inspection of the abdomen reveals - No Hernias. Skin - Scar - no surgical scars. Palpation/Percussion Palpation and Percussion of the abdomen reveal - Soft, Non Tender, No Rebound tenderness, No Rigidity (guarding) and No hepatosplenomegaly. Auscultation Auscultation of the abdomen reveals - Bowel sounds normal.  Neurologic Neurologic evaluation reveals -alert and oriented x 3 with no impairment of recent or remote memory. Mental Status-Normal.  Musculoskeletal Normal Exam - Left-Upper Extremity Strength Normal and Lower Extremity Strength Normal. Normal Exam - Right-Upper Extremity Strength Normal and Lower Extremity Strength Normal.  Lymphatic Head & Neck  General Head & Neck Lymphatics: Bilateral - Description - Normal. Axillary  General Axillary Region: Bilateral - Description - Normal. Tenderness - Non Tender. Femoral & Inguinal  Generalized Femoral & Inguinal Lymphatics: Bilateral - Description - Normal. Tenderness - Non Tender.    Assessment & Plan (Jordyne Poehlman A. Lianah Peed MD; 03/19/2014 3:29 PM)  DCIS (DUCTAL CARCINOMA IN SITU), LEFT (233.0  D05.12) Impression: 2 cm focus upper central breast good breast conservation candidate. discussed lumpectomy and mastectomy. she has chosen lumpectomy. Risk of lumpectomy include bleeding, infection, seroma, more surgery, use of seed/wire, wound care, cosmetic deformity and the need for other treatments, death , blood clots, death. Pt agrees to proceed.

## 2014-04-17 NOTE — Anesthesia Procedure Notes (Addendum)
Procedure Name: LMA Insertion Performed by: Alea Ryer Pre-anesthesia Checklist: Patient identified, Emergency Drugs available, Suction available and Patient being monitored Patient Re-evaluated:Patient Re-evaluated prior to inductionOxygen Delivery Method: Circle System Utilized Preoxygenation: Pre-oxygenation with 100% oxygen Intubation Type: IV induction Ventilation: Mask ventilation without difficulty LMA: LMA inserted LMA Size: 4.0 Number of attempts: 1 Airway Equipment and Method: Bite block Placement Confirmation: positive ETCO2 Tube secured with: Tape Dental Injury: Teeth and Oropharynx as per pre-operative assessment

## 2014-04-17 NOTE — Anesthesia Preprocedure Evaluation (Signed)
Anesthesia Evaluation  Patient identified by MRN, date of birth, ID band Patient awake    Reviewed: Allergy & Precautions, NPO status , Patient's Chart, lab work & pertinent test results  History of Anesthesia Complications (+) PONV  Airway Mallampati: I  TM Distance: >3 FB Neck ROM: Full    Dental   Pulmonary          Cardiovascular hypertension, Pt. on medications     Neuro/Psych    GI/Hepatic GERD-  Medicated and Controlled,  Endo/Other    Renal/GU      Musculoskeletal   Abdominal   Peds  Hematology   Anesthesia Other Findings   Reproductive/Obstetrics                             Anesthesia Physical Anesthesia Plan  ASA: II  Anesthesia Plan: General   Post-op Pain Management:    Induction: Intravenous  Airway Management Planned: LMA  Additional Equipment:   Intra-op Plan:   Post-operative Plan: Extubation in OR  Informed Consent: I have reviewed the patients History and Physical, chart, labs and discussed the procedure including the risks, benefits and alternatives for the proposed anesthesia with the patient or authorized representative who has indicated his/her understanding and acceptance.     Plan Discussed with: CRNA and Surgeon  Anesthesia Plan Comments:         Anesthesia Quick Evaluation

## 2014-04-17 NOTE — Anesthesia Postprocedure Evaluation (Signed)
Anesthesia Post Note  Patient: Marie Gentry  Procedure(s) Performed: Procedure(s) (LRB): LEFT BREAST LUMPECTOMY WITH RADIOACTIVE SEED LOCALIZATION (Left)  Anesthesia type: general  Patient location: PACU  Post pain: Pain level controlled  Post assessment: Patient's Cardiovascular Status Stable  Last Vitals:  Filed Vitals:   04/17/14 1413  BP: 125/56  Pulse: 70  Temp: 36.9 C  Resp: 16    Post vital signs: Reviewed and stable  Level of consciousness: sedated  Complications: No apparent anesthesia complications

## 2014-04-17 NOTE — Discharge Instructions (Signed)
[facility] °Office Phone Number [phone] ° °BREAST BIOPSY/ PARTIAL MASTECTOMY: POST OP INSTRUCTIONS ° °Always review your discharge instruction sheet given to you by the facility where your surgery was performed. ° °IF YOU HAVE DISABILITY OR FAMILY LEAVE FORMS, YOU MUST BRING THEM TO THE OFFICE FOR PROCESSING.  DO NOT GIVE THEM TO YOUR DOCTOR. ° °1. A prescription for pain medication may be given to you upon discharge.  Take your pain medication as prescribed, if needed.  If narcotic pain medicine is not needed, then you may take acetaminophen (Tylenol) or ibuprofen (Advil) as needed. °2. Take your usually prescribed medications unless otherwise directed °3. If you need a refill on your pain medication, please contact your pharmacy.  They will contact our office to request authorization.  Prescriptions will not be filled after 5pm or on week-ends. °4. You should eat very light the first 24 hours after surgery, such as soup, crackers, pudding, etc.  Resume your normal diet the day after surgery. °5. Most patients will experience some swelling and bruising in the breast.  Ice packs and a good support bra will help.  Swelling and bruising can take several days to resolve.  °6. It is common to experience some constipation if taking pain medication after surgery.  Increasing fluid intake and taking a stool softener will usually help or prevent this problem from occurring.  A mild laxative (Milk of Magnesia or Miralax) should be taken according to package directions if there are no bowel movements after 48 hours. °7. Unless discharge instructions indicate otherwise, you may remove your bandages 24-48 hours after surgery, and you may shower at that time.  You may have steri-strips (small skin tapes) in place directly over the incision.  These strips should be left on the skin for 7-10 days.  If your surgeon used skin glue on the incision, you may shower in 24 hours.  The glue will flake off over the  next 2-3 weeks.  Any sutures or staples will be removed at the office during your follow-up visit. °8. ACTIVITIES:  You may resume regular daily activities (gradually increasing) beginning the next day.  Wearing a good support bra or sports bra minimizes pain and swelling.  You may have sexual intercourse when it is comfortable. °a. You may drive when you no longer are taking prescription pain medication, you can comfortably wear a seatbelt, and you can safely maneuver your car and apply brakes. °b. RETURN TO WORK:  ______________________________________________________________________________________ °9. You should see your doctor in the office for a follow-up appointment approximately two weeks after your surgery.  Your doctor’s nurse will typically make your follow-up appointment when she calls you with your pathology report.  Expect your pathology report 2-3 business days after your surgery.  You may call to check if you do not hear from us after three days. °10. OTHER INSTRUCTIONS: _______________________________________________________________________________________________ _____________________________________________________________________________________________________________________________________ °_____________________________________________________________________________________________________________________________________ °_____________________________________________________________________________________________________________________________________ ° °WHEN TO CALL YOUR DOCTOR: °1. Fever over 101.0 °2. Nausea and/or vomiting. °3. Extreme swelling or bruising. °4. Continued bleeding from incision. °5. Increased pain, redness, or drainage from the incision. ° °The clinic staff is available to answer your questions during regular business hours.  Please don’t hesitate to call and ask to speak to one of the nurses for clinical concerns.  If you have a medical emergency, go to the nearest  emergency room or call 911.  A surgeon from [facility] is always on call at the hospital. ° °For further questions, please visit [email or web address]  ° ° °  Post Anesthesia Home Care Instructions ° °Activity: °Get plenty of rest for the remainder of the day. A responsible adult should stay with you for 24 hours following the procedure.  °For the next 24 hours, DO NOT: °-Drive a car °-Operate machinery °-Drink alcoholic beverages °-Take any medication unless instructed by your physician °-Make any legal decisions or sign important papers. ° °Meals: °Start with liquid foods such as gelatin or soup. Progress to regular foods as tolerated. Avoid greasy, spicy, heavy foods. If nausea and/or vomiting occur, drink only clear liquids until the nausea and/or vomiting subsides. Call your physician if vomiting continues. ° °Special Instructions/Symptoms: °Your throat may feel dry or sore from the anesthesia or the breathing tube placed in your throat during surgery. If this causes discomfort, gargle with warm salt water. The discomfort should disappear within 24 hours. ° °

## 2014-04-18 ENCOUNTER — Encounter (HOSPITAL_BASED_OUTPATIENT_CLINIC_OR_DEPARTMENT_OTHER): Payer: Self-pay | Admitting: Surgery

## 2014-04-29 NOTE — Progress Notes (Signed)
Location of Breast Cancer: Left Breast  Cancer Upper Outer Quadrant, DCIS   Histology per Pathology Report:Diagnosis  03/07/2014: Breast, left, needle core biopsy, upper outer quadrant, anterior depth- DUCTAL CARCINOMA IN SITU WITH CALCIFICATIONS, SEE COMMENT.   Receptor Status: ER( +  ), PR ( +  ), Her2-neu (   )  Did patient present with symptoms (if so, please note symptoms) or was this found on screening mammography?: found on screening mammogram   Past/Anticipated interventions by surgeon,:  Diagnosis 04/17/14: 1. Breast, lumpectomy, Left- DUCTAL CARCINOMA IN SITU, SEE COMMENT.- IN SITU CARCINOMA IS 0.5 CM FROM NEAREST MARGIN (SUPERIOR).- PREVIOUS BIOPSY SITE IDENTIFIED.- SEE TUMOR SYNOPTIC TEMPLATE BELOW.y   - SEE TUMOR SYNOPTIC TEMPLATE BELOW.2. Breast, excision, Left anterior margin- BENIGN BREAST TISSUE, SEE COMMENT.- NEGATIVE FOR ATYPIA OR MALIGNANCY.- SURGICAL MARGIN, NEGATIVE FOR ATYPIA OR MALIGNANCY.3. Breast, excision, Left superior margin- BENIGN BREAST TISSUE, SEE COMMENT.- NEGATIVE FOR ATYPIA OR MALIGNANCY.- SURGICAL MARGIN, NEGATIVE FOR ATYPIA OR MALIGNANCY.4. Breast, excision, Left inferior margin- BENIGN BREAST TISSUE, SEE COMMENT.- NEGATIVE FOR ATYPIA OR MALIGNANCY.- SURGICAL MARGIN, NEGATIVE FOR ATYPIA OR MALIGNANCY. 5. Breast, excision, Left medial margin- BENIGN BREAST TISSUE, SEE COMMENT - NEGATIVE FOR ATYPIA OR MALIGNANCY.- SURGICAL MARGIN, NEGATIVE FOR ATYPIA OR MALIGNANCY. 6. Breast, excision, Left lateral margin- BENIGN BREAST TISSUE, SEE COMMENT.- NEGATIVE FOR ATYPIA OR MALIGNANCY.- SURGICAL MARGIN, NEGATIVE FOR ATYPIA OR MALIGNANCY. 7. Breast, excision, Left posterior margin- BENIGN BREAST TISSUE, SEE COMMENT.- NEGATIVE FOR ATYPIA OR MALIGNANCY.- SURGICAL MARGIN, NEGATIVE FOR ATYPIA OR MALIGNANCY. Dr. Erroll Luna, , MD  Past/Anticipated interventions by medical oncology, if any: Chemotherapy : Seen in Breast Clinic , Dr. Burr Medico, 03/19/14, follow up 05/12/14  Lymphedema  issues, if any:  NO  Pain issues, if any:  Soreness left breat ,tingles at times.incision around nipple well healed   SAFETY ISSUES:  Prior radiation? NO  Pacemaker/ICD?  NO  Possible current pregnancy? N/A  Is the patient on methotrexate? NO  Current Complaints / other details:, Married, G4XP4, 1 died as an infant, menarche age 15,, No HRT, no contraceptives, last menstrual period Age 74;  never smoked cigarettes or smokeless tobacco, no illicit drug use, drinks alcohol wine  occasionally No family history  Cancer  Allergies: PCNS, HCTZ      Rebecca Eaton, RN 04/29/2014,11:08 AM

## 2014-04-30 ENCOUNTER — Ambulatory Visit
Admission: RE | Admit: 2014-04-30 | Discharge: 2014-04-30 | Disposition: A | Discharge status: Home / Self Care | Payer: Medicare PPO | Source: Ambulatory Visit | Attending: Radiation Oncology | Admitting: Radiation Oncology

## 2014-04-30 ENCOUNTER — Encounter: Payer: Self-pay | Admitting: Radiation Oncology

## 2014-04-30 VITALS — BP 114/92 | HR 66 | Temp 99.2°F | Resp 20 | Ht 65.0 in | Wt 196.2 lb

## 2014-04-30 DIAGNOSIS — Z170 Estrogen receptor positive status [ER+]: Secondary | ICD-10-CM | POA: Diagnosis not present

## 2014-04-30 DIAGNOSIS — C50412 Malignant neoplasm of upper-outer quadrant of left female breast: Secondary | ICD-10-CM

## 2014-04-30 DIAGNOSIS — D0512 Intraductal carcinoma in situ of left breast: Secondary | ICD-10-CM | POA: Diagnosis not present

## 2014-04-30 NOTE — Progress Notes (Signed)
Radiation Oncology         [phone] ________________________________  Name: Marie Gentry MRN: [id number]  Date: 04/30/2014  DOB: Mar 07, 1940  Follow-Up Visit Note  CC: [name], MD  Erroll Luna, MD  Diagnosis:      Breast cancer of upper-outer quadrant of left female breast   03/07/2014 Pathology Results Breast, left, needle core biopsy, upper outer quadrant, anterior depth - DUCTAL CARCINOMA IN SITU WITH CALCIFICATIONS, SEE COMMENT   03/11/2014 Initial Diagnosis Breast cancer of upper-outer quadrant of left female breast   03/18/2014 Breast MRI Biopsy clip artifact and biopsy changes within the anterior upper left breast in the area of biopsy-proven DCIS. Please note that there are remaining calcifications identified mammographically that lie 2 cm medial and 1.5 cm inferior to the biops   03/18/2014 Receptors her2 ER/PR+     Narrative:  The patient returns today for routine follow-up.  The patient proceeded to undergo a left-sided lumpectomy on 04/17/2014. This showed a 0.2 cm focus of ductal carcinoma in situ. The margins were negative. Receptor studies have indicated that the tumor is estrogen receptor positive and progesterone receptor positive.  The patient states that she has been recovering well without any unexpected difficulties.                              ALLERGIES:  is allergic to penicillins and hctz.  Meds: Current Outpatient Prescriptions  Medication Sig Dispense Refill  . acetaminophen (TYLENOL) 325 MG tablet Take 650 mg by mouth every 6 (six) hours as needed for mild pain (arthritis takes 2 tabs prn).    Marland Kitchen atenolol (TENORMIN) 25 MG tablet TAKE 1 TABLET EVERY DAY 90 tablet 1  . citalopram (CELEXA) 20 MG tablet TAKE 1 TABLET EVERY DAY 90 tablet 1  . indapamide (LOZOL) 1.25 MG tablet Take 1 tablet (1.25 mg total) by mouth daily. 90 tablet 1  . losartan (COZAAR) 100 MG tablet TAKE 1 TABLET EVERY MORNING 90 tablet 1  . omeprazole (PRILOSEC OTC) 20 MG  tablet Take 1 tablet (20 mg total) by mouth daily. 90 tablet 1  . oxyCODONE-acetaminophen (ROXICET) 5-325 MG per tablet Take 1 tablet by mouth every 4 (four) hours as needed. (Patient not taking: Reported on 04/30/2014) 30 tablet 0  . VITAMIN D, CHOLECALCIFEROL, PO Take 2,000 Units by mouth daily. 2,000 iu     No current facility-administered medications for this encounter.    Physical Findings: The patient is in no acute distress. Patient is alert and oriented.  height is 5' 5"  (1.651 m) and weight is 196 lb 3.2 oz (88.996 kg). Her oral temperature is 99.2 F (37.3 C). Her blood pressure is 114/92 and her pulse is 66. Her respiration is 20 and oxygen saturation is 100%. .     Lab Findings: Lab Results  Component Value Date   WBC 4.9 03/19/2014   HGB 13.4 03/19/2014   HCT 40.9 03/19/2014   MCV 94.7 03/19/2014   PLT 180 03/19/2014     Radiographic Findings: Mm Breast Surgical Specimen  04/17/2014   CLINICAL DATA:  Status post left lumpectomy.  EXAM: SPECIMEN RADIOGRAPH OF THE LEFT BREAST  COMPARISON:  Previous exam(s).  FINDINGS: Status post excision of the left breast. The radioactive seed and biopsy marker clip are present, completely intact, and are marked for pathology. These findings were discussed by telephone while the patient was in the operating room.  IMPRESSION: Specimen radiograph of the  left breast.   Electronically Signed   By: Andres Shad   On: 04/17/2014 11:37   Mm Lt Radioactive Seed Loc Mammo Guide  04/15/2014   CLINICAL DATA:  74 year old female for radioactive seed localization of left breast DCIS prior to left lumpectomy.  EXAM: MAMMOGRAPHIC GUIDED RADIOACTIVE SEED LOCALIZATION OF THE LEFT BREAST  COMPARISON:  Previous exam(s).  FINDINGS: Patient presents for radioactive seed localization prior to left lumpectomy. I met with the patient and we discussed the procedure of seed localization including benefits and alternatives. We discussed the high likelihood of a  successful procedure. We discussed the risks of the procedure including infection, bleeding, tissue injury and further surgery. We discussed the low dose of radioactivity involved in the procedure. Informed, written consent was given.  The usual time-out protocol was performed immediately prior to the procedure.  Using mammographic guidance, sterile technique, 2% lidocaine and an I-125 radioactive seed, the clip and remaining calcifications were localized using a lateral approach. The follow-up mammogram images confirm the seed between the biopsy clip and remaining calcifications and are marked for Dr. Brantley Stage.  Follow-up survey of the patient confirms presence of the radioactive seed.  Order number of I-125 seed:  [id number].  Total activity:  0.252 mCi  reference Date: 04/04/2014  The patient tolerated the procedure well and was released from the [facility]. She was given instructions regarding seed removal.  IMPRESSION: Radioactive seed localization left breast. No apparent complications.   Electronically Signed   By: Margarette Canada M.D.   On: 04/15/2014 14:39    Impression:    The patient is status post lumpectomy for ductal carcinoma in situ. The final pathologic size was significantly smaller than had been anticipated based on initial imaging. We discussed the potential role of radiation treatment in this setting in detail. Given the patient's age, final size, margin the patient is in a favorable situation currently. We also discussed that radiation treatment can reduce the chance of local failure. I do not believe that there would be a substantial impact on increasing overall survival.  We discussed therefore the pros and cons of radiation treatment at this point. I believe that she falls into a category where individualized care omitting radiation treatment would be appropriate if she wishes to proceed with this. After a detailed discussion with her and her husband today, she stated that she decided to  forego radiation treatment.  Plan:  No follow-up planned at this time in our clinic. The patient is scheduled to see medical oncology in a couple of weeks and she will further discuss anti-hormonal treatment at that time.   Jodelle Gross, M.D., Ph.D.

## 2014-05-12 ENCOUNTER — Ambulatory Visit (HOSPITAL_BASED_OUTPATIENT_CLINIC_OR_DEPARTMENT_OTHER): Payer: Medicare PPO

## 2014-05-12 ENCOUNTER — Telehealth: Payer: Self-pay | Admitting: Hematology

## 2014-05-12 ENCOUNTER — Encounter: Payer: Self-pay | Admitting: Hematology

## 2014-05-12 ENCOUNTER — Ambulatory Visit (HOSPITAL_BASED_OUTPATIENT_CLINIC_OR_DEPARTMENT_OTHER): Payer: Medicare PPO | Admitting: Hematology

## 2014-05-12 VITALS — BP 156/80 | HR 62 | Temp 98.2°F | Resp 18 | Ht 65.0 in | Wt 193.3 lb

## 2014-05-12 DIAGNOSIS — D0512 Intraductal carcinoma in situ of left breast: Secondary | ICD-10-CM

## 2014-05-12 DIAGNOSIS — C50412 Malignant neoplasm of upper-outer quadrant of left female breast: Secondary | ICD-10-CM

## 2014-05-12 DIAGNOSIS — Z170 Estrogen receptor positive status [ER+]: Secondary | ICD-10-CM

## 2014-05-12 LAB — CBC WITH DIFFERENTIAL/PLATELET
BASO%: 0.5 % (ref 0.0–2.0)
BASOS ABS: 0 10*3/uL (ref 0.0–0.1)
EOS ABS: 0.1 10*3/uL (ref 0.0–0.5)
EOS%: 1.8 % (ref 0.0–7.0)
HCT: 40.2 % (ref 34.8–46.6)
HEMOGLOBIN: 13.8 g/dL (ref 11.6–15.9)
LYMPH%: 38.6 % (ref 14.0–49.7)
MCH: 32.5 pg (ref 25.1–34.0)
MCHC: 34.3 g/dL (ref 31.5–36.0)
MCV: 94.8 fL (ref 79.5–101.0)
MONO#: 0.5 10*3/uL (ref 0.1–0.9)
MONO%: 9.3 % (ref 0.0–14.0)
NEUT#: 2.8 10*3/uL (ref 1.5–6.5)
NEUT%: 49.8 % (ref 38.4–76.8)
Platelets: 148 10*3/uL (ref 145–400)
RBC: 4.24 10*6/uL (ref 3.70–5.45)
RDW: 12.9 % (ref 11.2–14.5)
WBC: 5.6 10*3/uL (ref 3.9–10.3)
lymph#: 2.2 10*3/uL (ref 0.9–3.3)

## 2014-05-12 LAB — COMPREHENSIVE METABOLIC PANEL (CC13)
ALBUMIN: 3.7 g/dL (ref 3.5–5.0)
ALT: 22 U/L (ref 0–55)
AST: 18 U/L (ref 5–34)
Alkaline Phosphatase: 63 U/L (ref 40–150)
Anion Gap: 10 mEq/L (ref 3–11)
BUN: 11.9 mg/dL (ref 7.0–26.0)
CHLORIDE: 103 meq/L (ref 98–109)
CO2: 26 mEq/L (ref 22–29)
CREATININE: 0.8 mg/dL (ref 0.6–1.1)
Calcium: 9.5 mg/dL (ref 8.4–10.4)
EGFR: 69 mL/min/{1.73_m2} — AB (ref >90)
Glucose: 116 mg/dl (ref 70–140)
POTASSIUM: 3.6 meq/L (ref 3.5–5.1)
Sodium: 139 mEq/L (ref 136–145)
Total Bilirubin: 0.9 mg/dL (ref 0.20–1.20)
Total Protein: 6.9 g/dL (ref 6.4–8.3)

## 2014-05-12 MED ORDER — EXEMESTANE 25 MG PO TABS: 25.0000 mg | ORAL_TABLET | Freq: Every day | ORAL | Status: DC

## 2014-05-12 NOTE — Progress Notes (Signed)
[facility]  [phone]) [phone] Fax:([phone]) [place] Note   Patient Care Team: Kathyrn Drown, MD as PCP - General (Family Medicine) Erroll Luna, MD as Consulting Physician (General Surgery) Truitt Merle, MD as Consulting Physician (Hematology) Kyung Rudd, MD as Consulting Physician (Radiation Oncology) Rockwell Germany, RN as Registered Nurse Mauro Kaufmann, RN as Registered Nurse Holley Bouche, NP as Nurse Practitioner (Nurse Practitioner) 05/12/2014  CHIEF COMPLAINTS Follow up right breast DCIS    Breast cancer of upper-outer quadrant of left female breast   03/07/2014 Pathology Results Breast, left, needle core biopsy, upper outer quadrant, anterior depth - DUCTAL CARCINOMA IN SITU WITH CALCIFICATIONS, SEE COMMENT   03/11/2014 Initial Diagnosis Breast cancer of upper-outer quadrant of left female breast   03/18/2014 Breast MRI Biopsy clip artifact and biopsy changes within the anterior upper left breast in the area of biopsy-proven DCIS. Please note that there are remaining calcifications identified mammographically that lie 2 cm medial and 1.5 cm inferior to the biops   03/18/2014 Receptors her2 ER/PR+  ' HISTORY OF PRESENTING ILLNESS:  Marie Gentry 74 y.o. female presents to our breast multidisciplinary clinic today to discuss the management of her newly diagnosed breast cancer.  This was discovered on screening mammogram recently, which showed multiple sites of calcification in her left breast, across an area of 2-3 cm. She underwent ultrasound-guided biopsy on 03/11/2014, which showed DCIS, ER/PR positive. Her postbiopsy MRI on generally 19 2016 showed biopsy changes, without other findings.  She feels well overall, denies any symptoms. No recent weight loss or change of her appetite.  INTERIM HISTORY: She returns for follow up. She had lumpectomy on 04/17/2014 and did it very well. She has seen Dr. Lisbeth Renshaw and decided not to have  radiation. She denies any significant pain or other complaints.  she has good appetite and energy level, weight is stable. She and her husband is planning to move to [place] at the end of April.  MEDICAL HISTORY:  Past Medical History  Diagnosis Date  . Hypertension   . Hyperlipidemia   . Breast cancer of upper-outer quadrant of left female breast 03/11/2014  . Arthritis   . PONV (postoperative nausea and vomiting)   . Wears glasses   . Wears partial dentures   . GERD (gastroesophageal reflux disease)   . History of DVT (deep vein thrombosis) 1972    traveling  . History of cardiac murmur as a child     SURGICAL HISTORY: Past Surgical History  Procedure Laterality Date  . Pulmonary embolism surgery  1972    air travel  . Replacement total knee Right 2004  . Anterior fusion cervical spine  1990's  . Colonoscopy  2008    negative  . Cholecystectomy  01/2010    Dr. Arnoldo Morale  . Tonsillectomy    . Dilation and curettage of uterus    . Breast lumpectomy with radioactive seed localization Left 04/17/2014    Procedure: LEFT BREAST LUMPECTOMY WITH RADIOACTIVE SEED LOCALIZATION;  Surgeon: Erroll Luna, MD;  Location: [facility];  Service: General;  Laterality: Left;    SOCIAL HISTORY: History   Social History  . Marital Status: Married    Spouse Name: N/A    Number of Children: 3, one died as a infant  . Years of Education: N/A   Occupational History  . Not on file.   Social History Main Topics  . Smoking status: Never Smoker   . Smokeless  tobacco: Not on file  . Alcohol Use: Yes  . Drug Use: No  . Sexual Activity: Not on file   Other Topics Concern  . Not on file   Social History Narrative   GYN HISTORY  Menarchal: 14 LMP: early 53 Contraceptive: no  HRT: no  G4P4:   FAMILY HISTORY: No family history of malignancy.   ALLERGIES:  is allergic to penicillins and hctz.  MEDICATIONS:  Current Outpatient Prescriptions  Medication Sig Dispense  Refill  . acetaminophen (TYLENOL) 325 MG tablet Take 650 mg by mouth every 6 (six) hours as needed for mild pain (arthritis takes 2 tabs prn).    Marland Kitchen atenolol (TENORMIN) 25 MG tablet TAKE 1 TABLET EVERY DAY 90 tablet 1  . citalopram (CELEXA) 20 MG tablet TAKE 1 TABLET EVERY DAY 90 tablet 1  . indapamide (LOZOL) 1.25 MG tablet Take 1 tablet (1.25 mg total) by mouth daily. 90 tablet 1  . losartan (COZAAR) 100 MG tablet TAKE 1 TABLET EVERY MORNING 90 tablet 1  . omeprazole (PRILOSEC OTC) 20 MG tablet Take 1 tablet (20 mg total) by mouth daily. 90 tablet 1  . oxyCODONE-acetaminophen (ROXICET) 5-325 MG per tablet Take 1 tablet by mouth every 4 (four) hours as needed. (Patient not taking: Reported on 04/30/2014) 30 tablet 0  . VITAMIN D, CHOLECALCIFEROL, PO Take 2,000 Units by mouth daily. 2,000 iu     No current facility-administered medications for this visit.    REVIEW OF SYSTEMS:   Constitutional: Denies fevers, chills or abnormal night sweats Eyes: Denies blurriness of vision, double vision or watery eyes Ears, nose, mouth, throat, and face: Denies mucositis or sore throat Respiratory: Denies cough, dyspnea or wheezes Cardiovascular: Denies palpitation, chest discomfort or lower extremity swelling Gastrointestinal:  Denies nausea, heartburn or change in bowel habits Skin: Denies abnormal skin rashes Lymphatics: Denies new lymphadenopathy or easy bruising Neurological:Denies numbness, tingling or new weaknesses Behavioral/Psych: Mood is stable, no new changes  All other systems were reviewed with the patient and are negative.  PHYSICAL EXAMINATION: ECOG PERFORMANCE STATUS: 0 - Asymptomatic  Filed Vitals:   05/12/14 1305  BP: 156/80  Pulse: 62  Temp: 98.2 F (36.8 C)  Resp: 18   Filed Weights   05/12/14 1305  Weight: 193 lb 4.8 oz (87.68 kg)    GENERAL:alert, no distress and comfortable SKIN: skin color, texture, turgor are normal, no rashes or significant lesions EYES: normal,  conjunctiva are pink and non-injected, sclera clear OROPHARYNX:no exudate, no erythema and lips, buccal mucosa, and tongue normal  NECK: supple, thyroid normal size, non-tender, without nodularity LYMPH:  no palpable lymphadenopathy in the cervical, axillary or inguinal LUNGS: clear to auscultation and percussion with normal breathing effort HEART: regular rate & rhythm and no murmurs and no lower extremity edema ABDOMEN:abdomen soft, non-tender and normal bowel sounds Musculoskeletal:no cyanosis of digits and no clubbing  PSYCH: alert & oriented x 3 with fluent speech NEURO: no focal motor/sensory deficits Breasts: left Breast inspection showed  well-healed surgical scar with  some fullness  under the scar, no discret mass. No skin change or nipple discharge. Palpation of the right  breasts and axilla revealed no obvious mass that I could appreciate.   LABORATORY DATA:  I have reviewed the data as listed Lab Results  Component Value Date   WBC 4.9 03/19/2014   HGB 13.4 03/19/2014   HCT 40.9 03/19/2014   MCV 94.7 03/19/2014   PLT 180 03/19/2014    Recent Labs  03/19/14  1234  NA 138  K 4.1  CO2 28  GLUCOSE 103  BUN 27.6*  CREATININE 1.2*  CALCIUM 9.2  PROT 6.8  ALBUMIN 3.7  AST 19  ALT 20  ALKPHOS 59  BILITOT 0.39   PATHOLOGY REPORT 03/07/2014 1. Breast, lumpectomy, Left - DUCTAL CARCINOMA IN SITU, SEE COMMENT. - IN SITU CARCINOMA IS 0.5 CM FROM NEAREST MARGIN (SUPERIOR). - PREVIOUS BIOPSY SITE IDENTIFIED. - SEE TUMOR SYNOPTIC TEMPLATE BELOW. 2. Breast, excision, Left anterior margin - BENIGN BREAST TISSUE, SEE COMMENT. - NEGATIVE FOR ATYPIA OR MALIGNANCY. - SURGICAL MARGIN, NEGATIVE FOR ATYPIA OR MALIGNANCY. 3. Breast, excision, Left superior margin - BENIGN BREAST TISSUE, SEE COMMENT. - NEGATIVE FOR ATYPIA OR MALIGNANCY. - SURGICAL MARGIN, NEGATIVE FOR ATYPIA OR MALIGNANCY. 4. Breast, excision, Left inferior margin - BENIGN BREAST TISSUE, SEE COMMENT. -  NEGATIVE FOR ATYPIA OR MALIGNANCY. - SURGICAL MARGIN, NEGATIVE FOR ATYPIA OR MALIGNANCY. 5. Breast, excision, Left medial margin - BENIGN BREAST TISSUE, SEE COMMENT. - NEGATIVE FOR ATYPIA OR MALIGNANCY. - SURGICAL MARGIN, NEGATIVE FOR ATYPIA OR MALIGNANCY. 6. Breast, excision, Left lateral margin - BENIGN BREAST TISSUE, SEE COMMENT. - NEGATIVE FOR ATYPIA OR MALIGNANCY. - SURGICAL MARGIN, NEGATIVE FOR ATYPIA OR MALIGNANCY. 7. Breast, excision, Left posterior margin - BENIGN BREAST TISSUE, SEE COMMENT. - NEGATIVE FOR ATYPIA OR MALIGNANCY. - SURGICAL MARGIN, NEGATIVE FOR ATYPIA OR MALIGNANCY. Microscopic Comment 1. BREAST, IN SITU CARCINOMA Specimen, including laterality: Left breast without lymph node sampling. Procedure (include lymph node sampling sentinel-non-sentinel): Lumpectomy. Grade of carcinoma: I of III. Necrosis: Absent. Estimated tumor size: (glass slide measurement): 0.2 cm Treatment effect: None.  If present, treatment effect in breast tissue, lymph nodes or both: N/A. Distance to closest margin: 0.5 cm. If margin positive, focally or broadly: N/A. Breast prognostic profile: Not repeated. Estrogen receptor: Previous study demonstrated 100% positivity ([id number]). Progesterone receptor: Previus study demonstrated 100% positivity ([id number]). Lymph nodes: Examined: 0 Sentinel 0 Non-sentinel 0 Total Lymph nodes with metastasis: N/A. Isolated tumor cells (< 0.2 mm): N/A. Micrometastasis ( > 0.2 mm and < 2.0 mm): N/A. Macrometastasis (> 2.0 mm): N/A. Extranodal extension: N/A. TNM: pTis, pNX. Comments: Please see parts #2 through #7 for the corresponding final margin. 2. , through 7. In each part, the entire specimen was submitted for review. The surgical resection margin(s) of the specimen were inked and microscopically evaluated. There are no features of atypia or malignancy identified  RADIOGRAPHIC STUDIES: I have personally reviewed the radiological images as  listed and agreed with the findings in the report. Mm Breast Surgical Specimen  04/17/2014   CLINICAL DATA:  Status post left lumpectomy.  EXAM: SPECIMEN RADIOGRAPH OF THE LEFT BREAST  COMPARISON:  Previous exam(s).  FINDINGS: Status post excision of the left breast. The radioactive seed and biopsy marker clip are present, completely intact, and are marked for pathology. These findings were discussed by telephone while the patient was in the operating room.  IMPRESSION: Specimen radiograph of the left breast.   Electronically Signed   By: Andres Shad   On: 04/17/2014 11:37   Mm Lt Radioactive Seed Loc Mammo Guide  04/15/2014   CLINICAL DATA:  74 year old female for radioactive seed localization of left breast DCIS prior to left lumpectomy.  EXAM: MAMMOGRAPHIC GUIDED RADIOACTIVE SEED LOCALIZATION OF THE LEFT BREAST  COMPARISON:  Previous exam(s).  FINDINGS: Patient presents for radioactive seed localization prior to left lumpectomy. I met with the patient and we discussed the procedure of seed localization including benefits and alternatives. We  discussed the high likelihood of a successful procedure. We discussed the risks of the procedure including infection, bleeding, tissue injury and further surgery. We discussed the low dose of radioactivity involved in the procedure. Informed, written consent was given.  The usual time-out protocol was performed immediately prior to the procedure.  Using mammographic guidance, sterile technique, 2% lidocaine and an I-125 radioactive seed, the clip and remaining calcifications were localized using a lateral approach. The follow-up mammogram images confirm the seed between the biopsy clip and remaining calcifications and are marked for Dr. Brantley Stage.  Follow-up survey of the patient confirms presence of the radioactive seed.  Order number of I-125 seed:  [id number].  Total activity:  0.252 mCi  reference Date: 04/04/2014  The patient tolerated the procedure well and was  released from the [facility]. She was given instructions regarding seed removal.  IMPRESSION: Radioactive seed localization left breast. No apparent complications.   Electronically Signed   By: Margarette Canada M.D.   On: 04/15/2014 14:39    ASSESSMENT & PLAN:  74 year old postmenopausal woman, who was found to have left breast DCIS.  1. Left breast DCIS, ER and PR strongly positive -I reviewed her  final surgical pathology findings. She has non-invasive breast canand it is cured by complete surgical resection. -We discussed the role of adjuvant hormonal therapy to reduce her risk of cancer recurrence. Giving her postmenopausal status, I recommend aromatase inhibitor, potential side effects, which includes but not limited to, hot flash, vaginal dryness, musculoskeletal pain and stiffness, hyperlipidemia, osteopenia and osteoporosis, etc,  and benefit  were discussed with patient and she agrees to proceed. -I sent a prescription of anastrozole to her pharmacy.  I'll start her in a week.  PLAN: -Start Aromasin next week -bone density before next visit  -RTC in one month with lab  before she moves to [place].   All questions were answered. The patient knows to call the clinic with any problems, questions or concerns. I spent 30 minutes counseling the patient face to face. The total time spent in the appointment was 40 minutes and more than 50% was on counseling.     Truitt Merle, MD 05/12/2014 1:19 PM

## 2014-05-12 NOTE — Telephone Encounter (Signed)
Pt confirmed labs/ov per 03/14 POF, gave pt AVS.... KJ, sent msg to Dr. Burr Medico to change order per Elmo Putt to

## 2014-05-15 ENCOUNTER — Telehealth: Payer: Self-pay | Admitting: Adult Health

## 2014-05-15 NOTE — Telephone Encounter (Signed)
I called Marie Gentry to touch base regarding her pending move to [place] at the end of April and to figure out a plan to make sure she receives her survivorship care plan before she moves. I offered to mail her care plan to her home, along with my contact information in a letter and encouraged her to call me with any questions or concerns regarding the care plan and I would be happy to talk with her or see her before she moves out of town.    I encouraged her to call me with any needs that may arise before what will likely be her last visit to the cancer center to see Dr. Burr Medico on 06/12/14.  She expressed appreciation for my call and for mailing the care plan as she tries to prepare for her pending move.   Mike Craze, NP [place] [phone]

## 2014-05-16 ENCOUNTER — Encounter: Payer: Self-pay | Admitting: Adult Health

## 2014-05-16 NOTE — Progress Notes (Signed)
Marie Gentry elected to have her survivorship care plan mailed to her, as she is planning on moving to [place] in the next month or so making an in-person meeting to the [facility] more difficult.  Therefore, a letter and a copy of her Survivorship Care Plan document was mailed to her home per her request.  The letter encourages [name] to review the care plan and contact me with any questions or concerns.  My business card was included in the correspondence to the patient as well.  A copy of the care plan was also routed/faxed/mailed to [name], MD, the patient's PCP.  I will not be placing any follow-up appointments to the Survivorship Clinic for [name], although I am happy to see her in the future, if needed.   Mike Craze, NP [place] [phone]

## 2014-05-21 ENCOUNTER — Ambulatory Visit (HOSPITAL_COMMUNITY)
Admission: RE | Admit: 2014-05-21 | Discharge: 2014-05-21 | Disposition: A | Discharge status: Home / Self Care | Payer: Medicare PPO | Source: Ambulatory Visit | Attending: Hematology | Admitting: Hematology

## 2014-05-21 DIAGNOSIS — C50412 Malignant neoplasm of upper-outer quadrant of left female breast: Secondary | ICD-10-CM

## 2014-05-21 DIAGNOSIS — Z853 Personal history of malignant neoplasm of breast: Secondary | ICD-10-CM | POA: Diagnosis present

## 2014-05-21 DIAGNOSIS — M8580 Other specified disorders of bone density and structure, unspecified site: Secondary | ICD-10-CM | POA: Insufficient documentation

## 2014-06-12 ENCOUNTER — Ambulatory Visit (HOSPITAL_BASED_OUTPATIENT_CLINIC_OR_DEPARTMENT_OTHER): Payer: Medicare PPO | Admitting: Hematology

## 2014-06-12 ENCOUNTER — Other Ambulatory Visit (HOSPITAL_BASED_OUTPATIENT_CLINIC_OR_DEPARTMENT_OTHER): Payer: Medicare PPO

## 2014-06-12 ENCOUNTER — Encounter: Payer: Self-pay | Admitting: Hematology

## 2014-06-12 VITALS — BP 135/69 | HR 61 | Temp 98.2°F | Resp 18 | Ht 65.0 in | Wt 193.6 lb

## 2014-06-12 DIAGNOSIS — C50412 Malignant neoplasm of upper-outer quadrant of left female breast: Secondary | ICD-10-CM

## 2014-06-12 DIAGNOSIS — D0512 Intraductal carcinoma in situ of left breast: Secondary | ICD-10-CM

## 2014-06-12 DIAGNOSIS — Z170 Estrogen receptor positive status [ER+]: Secondary | ICD-10-CM | POA: Diagnosis not present

## 2014-06-12 LAB — COMPREHENSIVE METABOLIC PANEL (CC13)
ALK PHOS: 55 U/L (ref 40–150)
ALT: 21 U/L (ref 0–55)
AST: 22 U/L (ref 5–34)
Albumin: 3.7 g/dL (ref 3.5–5.0)
Anion Gap: 8 mEq/L (ref 3–11)
BUN: 9.9 mg/dL (ref 7.0–26.0)
CHLORIDE: 100 meq/L (ref 98–109)
CO2: 29 mEq/L (ref 22–29)
Calcium: 9.5 mg/dL (ref 8.4–10.4)
Creatinine: 0.8 mg/dL (ref 0.6–1.1)
EGFR: 69 mL/min/{1.73_m2} — ABNORMAL LOW (ref >90)
Glucose: 109 mg/dl (ref 70–140)
Potassium: 3.9 mEq/L (ref 3.5–5.1)
SODIUM: 137 meq/L (ref 136–145)
TOTAL PROTEIN: 7 g/dL (ref 6.4–8.3)
Total Bilirubin: 0.71 mg/dL (ref 0.20–1.20)

## 2014-06-12 LAB — CBC WITH DIFFERENTIAL/PLATELET
BASO%: 1 % (ref 0.0–2.0)
Basophils Absolute: 0.1 10*3/uL (ref 0.0–0.1)
EOS ABS: 0.1 10*3/uL (ref 0.0–0.5)
EOS%: 1.5 % (ref 0.0–7.0)
HCT: 43.8 % (ref 34.8–46.6)
HGB: 14.7 g/dL (ref 11.6–15.9)
LYMPH%: 39.5 % (ref 14.0–49.7)
MCH: 31.9 pg (ref 25.1–34.0)
MCHC: 33.6 g/dL (ref 31.5–36.0)
MCV: 94.8 fL (ref 79.5–101.0)
MONO#: 0.5 10*3/uL (ref 0.1–0.9)
MONO%: 8.6 % (ref 0.0–14.0)
NEUT#: 2.8 10*3/uL (ref 1.5–6.5)
NEUT%: 49.4 % (ref 38.4–76.8)
PLATELETS: 199 10*3/uL (ref 145–400)
RBC: 4.62 10*6/uL (ref 3.70–5.45)
RDW: 13.3 % (ref 11.2–14.5)
WBC: 5.7 10*3/uL (ref 3.9–10.3)
lymph#: 2.3 10*3/uL (ref 0.9–3.3)

## 2014-06-12 MED ORDER — EXEMESTANE 25 MG PO TABS: 25.0000 mg | ORAL_TABLET | Freq: Every day | ORAL | Status: AC

## 2014-06-12 NOTE — Progress Notes (Signed)
[facility]  Telephone:([phone]) [phone] Fax:([phone]) [place] Note   Patient Care Team: Kathyrn Drown, MD as PCP - General (Family Medicine) Erroll Luna, MD as Consulting Physician (General Surgery) Truitt Merle, MD as Consulting Physician (Hematology) Kyung Rudd, MD as Consulting Physician (Radiation Oncology) Rockwell Germany, RN as Registered Nurse Mauro Kaufmann, RN as Registered Nurse Holley Bouche, NP as Nurse Practitioner (Nurse Practitioner) 06/12/2014  CHIEF COMPLAINTS Follow up right breast DCIS    Breast cancer of upper-outer quadrant of left female breast   03/07/2014 Initial Biopsy Left needle core biopsy, UOQ, anterior depth revealed grade 1 DCIS with calcs.  ER+ (100%), PR+ (100%).    03/11/2014 Initial Diagnosis Breast cancer of upper-outer quadrant of left female breast   03/18/2014 Breast MRI Biopsy clip artifact and biopsy changes within the anterior upper left breast in the area of biopsy-proven DCIS. Please note that there are remaining calcifications identified mammographically that lie 2 cm medial and 1.5 cm inferior to the biops   04/17/2014 Definitive Surgery Left lumpectomy showed grade 1 DCIS, with estimated tumor size of 0.2 cm.  Negative margins.  No lymph nodes removed.  ER+/PR+    Anti-estrogen oral therapy Anastrazole 1mg  daily.  Planned duration of treatment: 5 years.  ' HISTORY OF PRESENTING ILLNESS:  Marie Gentry 74 y.o. female presents to our breast multidisciplinary clinic today to discuss the management of her newly diagnosed breast cancer.  This was discovered on screening mammogram recently, which showed multiple sites of calcification in her left breast, across an area of 2-3 cm. She underwent ultrasound-guided biopsy on 03/11/2014, which showed DCIS, ER/PR positive. Her postbiopsy MRI on generally 19 2016 showed biopsy changes, without other findings.  She feels well overall, denies any symptoms. No recent  weight loss or change of her appetite.  INTERIM HISTORY: She returns for follow up. She has been on aromasin for one month, no noticeable side effects. She has chronic right wrist and back pain, no other issues. She is moving to [place] next week, to lives closer to her daughter.   MEDICAL HISTORY:  Past Medical History  Diagnosis Date  . Hypertension   . Hyperlipidemia   . Breast cancer of upper-outer quadrant of left female breast 03/11/2014  . Arthritis   . PONV (postoperative nausea and vomiting)   . Wears glasses   . Wears partial dentures   . GERD (gastroesophageal reflux disease)   . History of DVT (deep vein thrombosis) 1972    traveling  . History of cardiac murmur as a child     SURGICAL HISTORY: Past Surgical History  Procedure Laterality Date  . Pulmonary embolism surgery  1972    air travel  . Replacement total knee Right 2004  . Anterior fusion cervical spine  1990's  . Colonoscopy  2008    negative  . Cholecystectomy  01/2010    Dr. Arnoldo Morale  . Tonsillectomy    . Dilation and curettage of uterus    . Breast lumpectomy with radioactive seed localization Left 04/17/2014    Procedure: LEFT BREAST LUMPECTOMY WITH RADIOACTIVE SEED LOCALIZATION;  Surgeon: Erroll Luna, MD;  Location: [facility];  Service: General;  Laterality: Left;    SOCIAL HISTORY: History   Social History  . Marital Status: Married    Spouse Name: N/A    Number of Children: 3, one died as a infant  . Years of Education: N/A   Occupational History  .  Not on file.   Social History Main Topics  . Smoking status: Never Smoker   . Smokeless tobacco: Not on file  . Alcohol Use: Yes  . Drug Use: No  . Sexual Activity: Not on file   Other Topics Concern  . Not on file   Social History Narrative   GYN HISTORY  Menarchal: 14 LMP: early 76 Contraceptive: no  HRT: no  G4P4:   FAMILY HISTORY: No family history of malignancy.   ALLERGIES:  is allergic to penicillins and  hctz.  MEDICATIONS:  Current Outpatient Prescriptions  Medication Sig Dispense Refill  . acetaminophen (TYLENOL) 325 MG tablet Take 650 mg by mouth every 6 (six) hours as needed for mild pain (arthritis takes 2 tabs prn).    Marland Kitchen atenolol (TENORMIN) 25 MG tablet TAKE 1 TABLET EVERY DAY 90 tablet 1  . citalopram (CELEXA) 20 MG tablet TAKE 1 TABLET EVERY DAY 90 tablet 1  . exemestane (AROMASIN) 25 MG tablet Take 1 tablet (25 mg total) by mouth daily after breakfast. 30 tablet 0  . indapamide (LOZOL) 1.25 MG tablet Take 1 tablet (1.25 mg total) by mouth daily. 90 tablet 1  . losartan (COZAAR) 100 MG tablet TAKE 1 TABLET EVERY MORNING 90 tablet 1  . omeprazole (PRILOSEC OTC) 20 MG tablet Take 1 tablet (20 mg total) by mouth daily. 90 tablet 1  . VITAMIN D, CHOLECALCIFEROL, PO Take 2,000 Units by mouth daily. 2,000 iu     No current facility-administered medications for this visit.    REVIEW OF SYSTEMS:   Constitutional: Denies fevers, chills or abnormal night sweats Eyes: Denies blurriness of vision, double vision or watery eyes Ears, nose, mouth, throat, and face: Denies mucositis or sore throat Respiratory: Denies cough, dyspnea or wheezes Cardiovascular: Denies palpitation, chest discomfort or lower extremity swelling Gastrointestinal:  Denies nausea, heartburn or change in bowel habits Skin: Denies abnormal skin rashes Lymphatics: Denies new lymphadenopathy or easy bruising Neurological:Denies numbness, tingling or new weaknesses Behavioral/Psych: Mood is stable, no new changes  All other systems were reviewed with the patient and are negative.  PHYSICAL EXAMINATION: ECOG PERFORMANCE STATUS: 0 - Asymptomatic  Filed Vitals:   06/12/14 1237  BP: 135/69  Pulse: 61  Temp: 98.2 F (36.8 C)  Resp: 18   Filed Weights   06/12/14 1237  Weight: 193 lb 9.6 oz (87.816 kg)    GENERAL:alert, no distress and comfortable SKIN: skin color, texture, turgor are normal, no rashes or  significant lesions EYES: normal, conjunctiva are pink and non-injected, sclera clear OROPHARYNX:no exudate, no erythema and lips, buccal mucosa, and tongue normal  NECK: supple, thyroid normal size, non-tender, without nodularity LYMPH:  no palpable lymphadenopathy in the cervical, axillary or inguinal LUNGS: clear to auscultation and percussion with normal breathing effort HEART: regular rate & rhythm and no murmurs and no lower extremity edema ABDOMEN:abdomen soft, non-tender and normal bowel sounds Musculoskeletal:no cyanosis of digits and no clubbing  PSYCH: alert & oriented x 3 with fluent speech NEURO: no focal motor/sensory deficits Breasts: left Breast inspection showed  well-healed surgical scar with  some fullness  under the scar, no discret mass. No skin change or nipple discharge. Palpation of the right  breasts and axilla revealed no obvious mass that I could appreciate.   LABORATORY DATA:  I have reviewed the data as listed Lab Results  Component Value Date   WBC 5.7 06/12/2014   HGB 14.7 06/12/2014   HCT 43.8 06/12/2014   MCV 94.8 06/12/2014  PLT 199 06/12/2014    Recent Labs  03/19/14 1234 05/12/14 1434  NA 138 139  K 4.1 3.6  CO2 28 26  GLUCOSE 103 116  BUN 27.6* 11.9  CREATININE 1.2* 0.8  CALCIUM 9.2 9.5  PROT 6.8 6.9  ALBUMIN 3.7 3.7  AST 19 18  ALT 20 22  ALKPHOS 59 63  BILITOT 0.39 0.90   PATHOLOGY REPORT 03/07/2014 1. Breast, lumpectomy, Left - DUCTAL CARCINOMA IN SITU, SEE COMMENT. - IN SITU CARCINOMA IS 0.5 CM FROM NEAREST MARGIN (SUPERIOR). - PREVIOUS BIOPSY SITE IDENTIFIED. - SEE TUMOR SYNOPTIC TEMPLATE BELOW. 2. Breast, excision, Left anterior margin - BENIGN BREAST TISSUE, SEE COMMENT. - NEGATIVE FOR ATYPIA OR MALIGNANCY. - SURGICAL MARGIN, NEGATIVE FOR ATYPIA OR MALIGNANCY. 3. Breast, excision, Left superior margin - BENIGN BREAST TISSUE, SEE COMMENT. - NEGATIVE FOR ATYPIA OR MALIGNANCY. - SURGICAL MARGIN, NEGATIVE FOR ATYPIA OR  MALIGNANCY. 4. Breast, excision, Left inferior margin - BENIGN BREAST TISSUE, SEE COMMENT. - NEGATIVE FOR ATYPIA OR MALIGNANCY. - SURGICAL MARGIN, NEGATIVE FOR ATYPIA OR MALIGNANCY. 5. Breast, excision, Left medial margin - BENIGN BREAST TISSUE, SEE COMMENT. - NEGATIVE FOR ATYPIA OR MALIGNANCY. - SURGICAL MARGIN, NEGATIVE FOR ATYPIA OR MALIGNANCY. 6. Breast, excision, Left lateral margin - BENIGN BREAST TISSUE, SEE COMMENT. - NEGATIVE FOR ATYPIA OR MALIGNANCY. - SURGICAL MARGIN, NEGATIVE FOR ATYPIA OR MALIGNANCY. 7. Breast, excision, Left posterior margin - BENIGN BREAST TISSUE, SEE COMMENT. - NEGATIVE FOR ATYPIA OR MALIGNANCY. - SURGICAL MARGIN, NEGATIVE FOR ATYPIA OR MALIGNANCY. Microscopic Comment 1. BREAST, IN SITU CARCINOMA Specimen, including laterality: Left breast without lymph node sampling. Procedure (include lymph node sampling sentinel-non-sentinel): Lumpectomy. Grade of carcinoma: I of III. Necrosis: Absent. Estimated tumor size: (glass slide measurement): 0.2 cm Treatment effect: None.  If present, treatment effect in breast tissue, lymph nodes or both: N/A. Distance to closest margin: 0.5 cm. If margin positive, focally or broadly: N/A. Breast prognostic profile: Not repeated. Estrogen receptor: Previous study demonstrated 100% positivity ([id number]). Progesterone receptor: Previus study demonstrated 100% positivity ([id number]). Lymph nodes: Examined: 0 Sentinel 0 Non-sentinel 0 Total Lymph nodes with metastasis: N/A. Isolated tumor cells (< 0.2 mm): N/A. Micrometastasis ( > 0.2 mm and < 2.0 mm): N/A. Macrometastasis (> 2.0 mm): N/A. Extranodal extension: N/A. TNM: pTis, pNX. Comments: Please see parts #2 through #7 for the corresponding final margin. 2. , through 7. In each part, the entire specimen was submitted for review. The surgical resection margin(s) of the specimen were inked and microscopically evaluated. There are no features of atypia or  malignancy identified  RADIOGRAPHIC STUDIES: I have personally reviewed the radiological images as listed and agreed with the findings in the report.  Dg Bone Density 05/21/2014    ASSESSMENT: BMD as determined from Femur Neck Left is 0.867 g/cm2 with a T-Score of -1.2. This patient is considered osteopenic according to [facility]) criteria. Compared with the prior study on 01/19/2009, the BMD of the left femoral neck shows a statistically significant decrease. (Lumbar spine was not utilized due to advanced degenerative changes.) See FRAX report on next page.  World Pharmacologist (WHO) criteria for post-menopausal, Caucasian Women: Normal:       T-score at or above -1 SD Osteopenia:   T-score between -1 and -2.5 SD Osteoporosis: T-score at or below -2.5 SD  RECOMMENDATIONS: [facility] recommends that FDA-approved medial therapies be considered in postmenopausal women and men age 49 or older with a: 1. Hip or vertberal (clinical or morphometric) fracture.  2. T-Score of < -2.5 at the spine or hip. 3. Ten-year fracture probability by FRAX of 3% or greater for hip fracture or 20% or greater for major osteoporotic fracture.  All treatment decisions require clinical judgment and consideration of indiviual patient factors, including patient preferences, co-morbidities, previous drug use, risk factors not captured in the FRAX model (e.g. falls, vitamin D deficiency, increased bone turnover, interval significant decline in bone density) and possible under-or over-estimation of fracture risk by FRAX.  All patients should ensure an adequate intake of dietary calcium (1200 mg/d) and vitamin D (800 IU daily) unless contraindicated.  FOLLOW-UP: People with diagnosed cases of osteoporosis or osteopenia should be regularly tested for bone mineral density. For patients eligible for Medicare, routine testing is allowed once every 2 years. Testing frequency can be increased for  patients who have rapidly progressing disease, or for those who are receiving medical therapy to restore bone mass.  I have reviewed this report, and agree with the above findings.  [facility] Your patient BRIANY AYE completed a FRAX assessment on 05/21/2014 using the [place] (analysis version: 14.10) manufactured by [employer]. The following summarizes the results of our evaluation.  PATIENT BIOGRAPHICAL: Name: Marie Gentry, Marie Gentry Patient ID: [id number] Birth Date: 06/27/1940 Height:    65.0 in. Gender:     Female    [age]       Weight:    193.0 lbs. Ethnicity:  White                            Exam Date: 05/21/2014  FRAX* RESULTS:  (version: 3.5) 10-year Probability of Fracture1 Major Osteoporotic Fracture2 Hip Fracture 15.0% 2.2% Population: [place] (Caucasian) Risk Factors: History of Fracture (Adult)  Based on Femur (Left) Neck BMD  1 -The 10-year probability of fracture may be lower than reported if the patient has received treatment. 2 -Major Osteoporotic Fracture: Clinical Spine, Forearm, Hip or Shoulder  *FRAX is a Materials engineer of the [employer] for Metabolic Bone Disease, a [place] (WHO) [facility].  ASSESSMENT: The probability of a major osteoporotic fracture is 15.0% within the next ten years.  The probability of a hip fracture is 2.2% within the next ten years.  I have reviewed the above results and report and agree with the findings.  Mark A. Thornton Papas, M.D.  Certified [facility]   Electronically Signed   By: Lavonia Dana M.D.   On: 05/21/2014 14:18    ASSESSMENT & PLAN:  74 year old postmenopausal woman, who was found to have left breast DCIS.  1. Left breast DCIS, ER and PR strongly positive -I reviewed her  final surgical pathology findings. She has non-invasive breast canand it is cured by complete surgical resection. -We discussed the role of  adjuvant hormonal therapy to prevent future breast cancer. Giving her postmenopausal status, I recommend aromatase inhibitor, potential side effects, which includes but not limited to, hot flash, vaginal dryness, musculoskeletal pain and stiffness, hyperlipidemia, osteopenia and osteoporosis, etc,  and benefit  were discussed with patient and she agreed to proceed. -She is tolerating Aromasin very well, we'll continue, for a total of 5 years. -I refilled her Aromasin for 6 month -She will identify a medical oncologist in [place] when she moves there. She knows to call us if she needs her medical records to be faxed over.  All questions were answered. The patient knows to call the clinic with any problems, questions or concerns. I spent 20 minutes counseling the patient face to face. The total time spent in the appointment was 25 minutes and more than 50% was on counseling.     Truitt Merle, MD 06/12/2014 12:58 PM

## 2014-09-08 ENCOUNTER — Telehealth: Payer: Self-pay | Admitting: Adult Health

## 2014-09-08 NOTE — Telephone Encounter (Signed)
Ms. Marie Gentry called my office to inquire about having her medical records released to her new oncologist in [place], as she has recently moved.  I gave her the number for our HIM office and encouraged her to call them directly.  I am also routing this phone call note to make them aware as well.  I encouraged Ms. Marie Gentry to call me back if she had any more problems or if there was anything else I could help her with.   Mike Craze, NP [place] [phone]

## 2014-09-10 ENCOUNTER — Telehealth: Payer: Self-pay | Admitting: Hematology

## 2014-09-10 NOTE — Telephone Encounter (Signed)
Left message with Mr. Barletta to return call

## 2015-01-15 ENCOUNTER — Other Ambulatory Visit: Payer: Self-pay | Admitting: Family Medicine

## 2015-04-27 ENCOUNTER — Other Ambulatory Visit: Payer: Self-pay | Admitting: Family Medicine

## 2016-02-03 ENCOUNTER — Telehealth: Payer: Self-pay | Admitting: Hematology

## 2016-02-03 NOTE — Telephone Encounter (Signed)
Faxed records to [facility] [phone]

## 2016-05-03 ENCOUNTER — Telehealth: Payer: Self-pay | Admitting: Hematology

## 2016-05-03 NOTE — Telephone Encounter (Signed)
FAXED OFFICE NOTE AND PATH REPORT TO [facility] [phone]

## 2016-08-17 IMAGING — MG MM DIGITAL SCREENING BILAT W/ CAD
5 series · 5 of 5 positions shown · non-contrast
Comparison: Previous Exam(s)

CLINICAL DATA: Screening.

EXAM:
DIGITAL SCREENING BILATERAL MAMMOGRAM WITH CAD

[L CC]
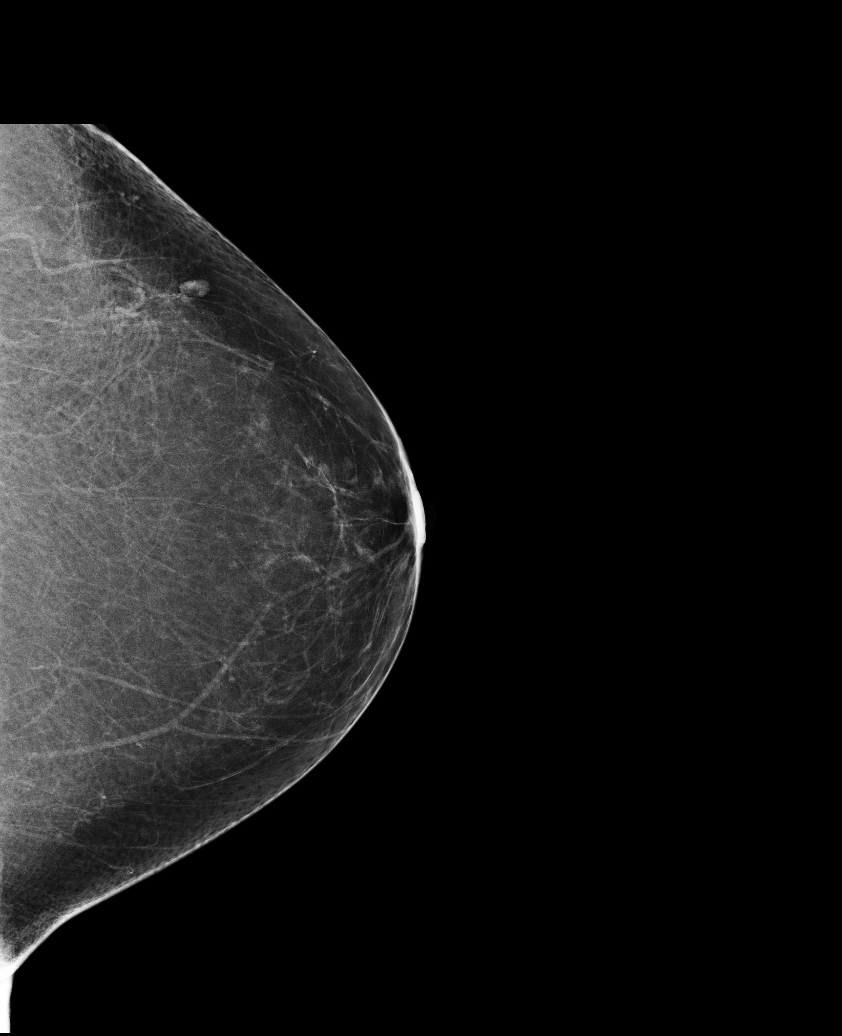

[L MLO]
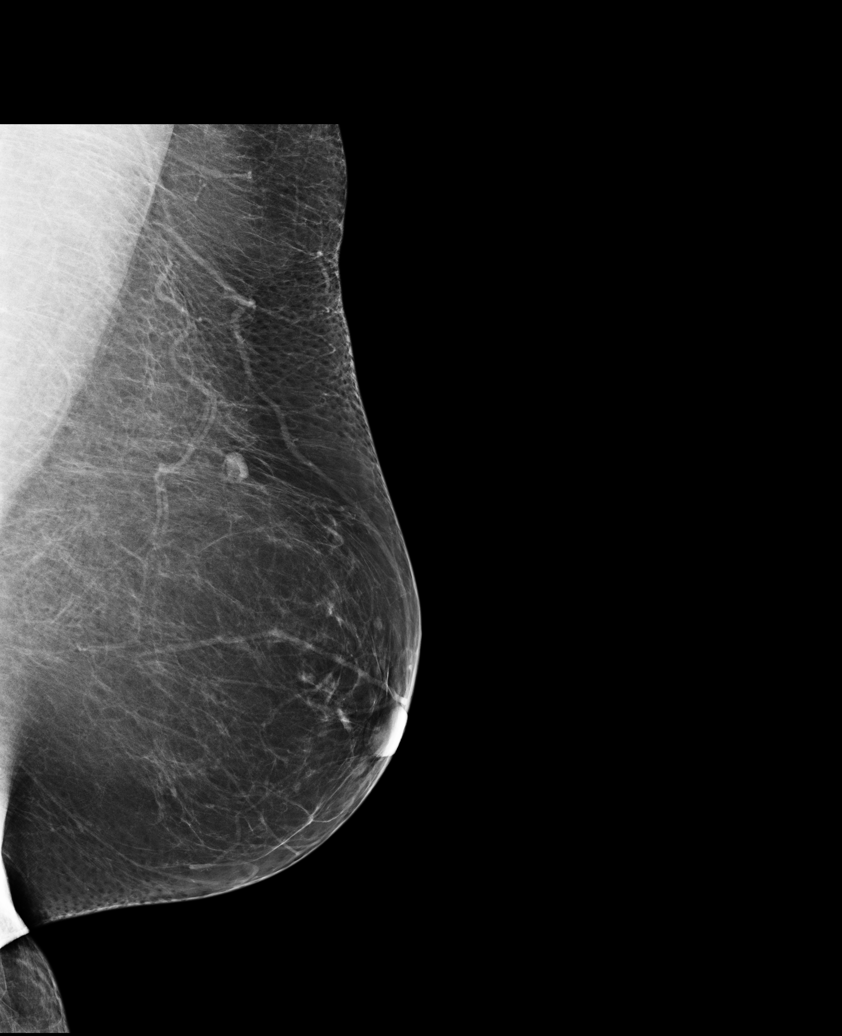

[R CC]
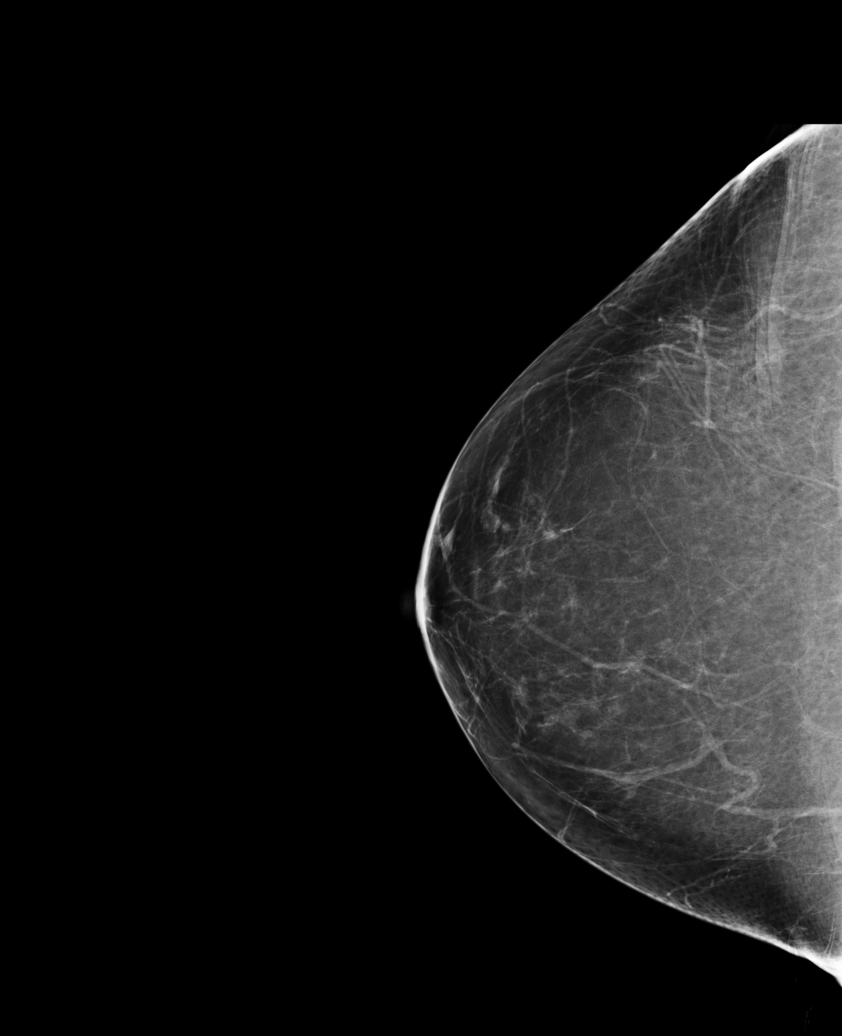

[R MLO (1 of 2)]
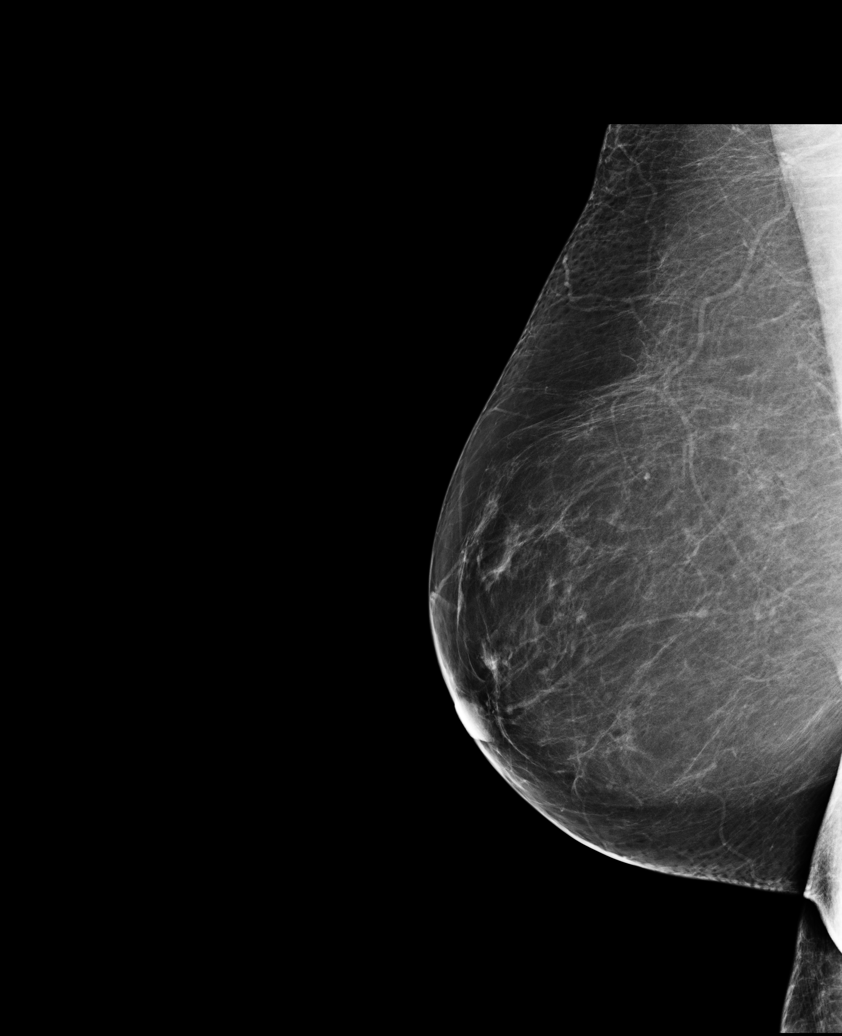

[R MLO (2 of 2)]
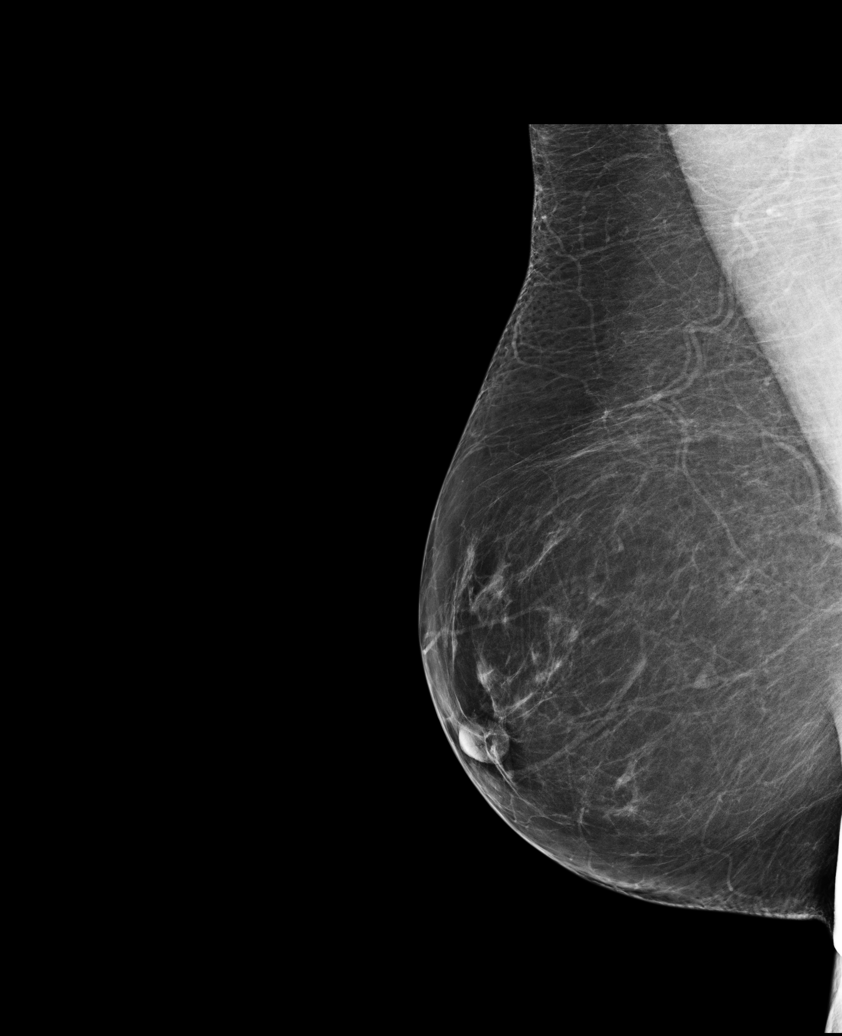

[5 of 5 positions shown; findings below may reference images not displayed]

ACR Breast Density Category b: There are scattered areas of
fibroglandular density.
FINDINGS: In the left breast, calcifications warrant further evaluation with
magnified views. In the right breast, no findings suspicious for
malignancy. Images were processed with CAD.
IMPRESSION: Further evaluation is suggested for calcifications in the left
breast.

RECOMMENDATION:
Diagnostic mammogram of the left breast. (Code:[id number])

The patient will be contacted regarding the findings, and additional
imaging will be scheduled.

BI-RADS CATEGORY  0: Incomplete. Need additional imaging evaluation
and/or prior mammograms for comparison.
